# Patient Record
Sex: Male | Born: 1935 | Hispanic: No | Marital: Married | Smoking: Never smoker
Health system: Southern US, Community
[De-identification: ages and names within clinical notes are randomized; demographics above are authoritative.]

## PROBLEM LIST (undated history)

## (undated) DIAGNOSIS — F0390 Unspecified dementia without behavioral disturbance: Secondary | ICD-10-CM

## (undated) DIAGNOSIS — N400 Enlarged prostate without lower urinary tract symptoms: Secondary | ICD-10-CM

## (undated) HISTORY — DX: Benign prostatic hyperplasia without lower urinary tract symptoms: N40.0

---

## 2005-05-23 ENCOUNTER — Encounter: Admission: RE | Admit: 2005-05-23 | Discharge: 2005-05-23 | Payer: Self-pay | Admitting: Family Medicine

## 2005-05-26 ENCOUNTER — Encounter: Admission: RE | Admit: 2005-05-26 | Discharge: 2005-05-26 | Payer: Self-pay | Admitting: Family Medicine

## 2010-01-29 ENCOUNTER — Encounter: Payer: Self-pay | Admitting: Gastroenterology

## 2014-03-18 ENCOUNTER — Ambulatory Visit (INDEPENDENT_AMBULATORY_CARE_PROVIDER_SITE_OTHER): Payer: Medicare Other | Admitting: Internal Medicine

## 2014-03-18 ENCOUNTER — Other Ambulatory Visit (INDEPENDENT_AMBULATORY_CARE_PROVIDER_SITE_OTHER): Payer: Medicare Other

## 2014-03-18 VITALS — BP 158/76 | HR 89 | Temp 97.8°F | Resp 18 | Wt 130.0 lb

## 2014-03-18 DIAGNOSIS — N400 Enlarged prostate without lower urinary tract symptoms: Secondary | ICD-10-CM

## 2014-03-18 DIAGNOSIS — R413 Other amnesia: Secondary | ICD-10-CM

## 2014-03-18 DIAGNOSIS — Z0000 Encounter for general adult medical examination without abnormal findings: Secondary | ICD-10-CM

## 2014-03-18 LAB — COMPREHENSIVE METABOLIC PANEL
ALBUMIN: 4.4 g/dL (ref 3.5–5.2)
ALT: 19 U/L (ref 0–53)
AST: 20 U/L (ref 0–37)
Alkaline Phosphatase: 65 U/L (ref 39–117)
BUN: 26 mg/dL — AB (ref 6–23)
CALCIUM: 10.1 mg/dL (ref 8.4–10.5)
CO2: 30 meq/L (ref 19–32)
Chloride: 101 mEq/L (ref 96–112)
Creatinine, Ser: 1.27 mg/dL (ref 0.40–1.50)
GFR: 56.87 mL/min — ABNORMAL LOW (ref >60.00)
Glucose, Bld: 83 mg/dL (ref 70–99)
Potassium: 4.4 mEq/L (ref 3.5–5.1)
SODIUM: 136 meq/L (ref 135–145)
TOTAL PROTEIN: 7.8 g/dL (ref 6.0–8.3)
Total Bilirubin: 0.4 mg/dL (ref 0.2–1.2)

## 2014-03-18 LAB — VITAMIN B12: Vitamin B-12: 687 pg/mL (ref 211–911)

## 2014-03-18 LAB — LIPID PANEL
CHOLESTEROL: 220 mg/dL — AB (ref 0–200)
HDL: 99.6 mg/dL (ref >39.00)
LDL CALC: 104 mg/dL — AB (ref 0–99)
NonHDL: 120.4
TRIGLYCERIDES: 84 mg/dL (ref 0.0–149.0)
Total CHOL/HDL Ratio: 2
VLDL: 16.8 mg/dL (ref 0.0–40.0)

## 2014-03-18 LAB — VITAMIN D 25 HYDROXY (VIT D DEFICIENCY, FRACTURES): VITD: 55.72 ng/mL (ref 30.00–100.00)

## 2014-03-18 LAB — FOLATE: Folate: >23.3 ng/mL (ref >5.9)

## 2014-03-18 MED ORDER — DONEPEZIL HCL 5 MG PO TABS: 5.0000 mg | ORAL_TABLET | Freq: Every day | ORAL | Status: DC

## 2014-03-18 NOTE — Progress Notes (Signed)
Pre visit review using our clinic review tool, if applicable. No additional management support is needed unless otherwise documented below in the visit note. 

## 2014-03-18 NOTE — Patient Instructions (Addendum)
We will check some blood work today to check some any nutrition problems. We will call you back about the results.   I have given you a prescription for the memory medicine called aricept (also donepezil). Take 1 pill daily. The most common side effect is stomach upset or loose stools. Those side effects usually go away in about 1-2 weeks but if they are severe call us and we can stop the medicine.   We will see you back in about 3 months to check on the memory.   Talk to the urologist about your concerns about the medicine on Monday. If you have any questions or problems please feel free to call the office.   Memory Compensation Strategies  1. Use "WARM" strategy.  W= write it down  A= associate it  R= repeat it  M= make a mental note  2.   You can keep a Glass blower/designerMemory Notebook.  Use a 3-ring notebook with sections for the following: calendar, important names and phone numbers,  medications, doctors' names/phone numbers, lists/reminders, and a section to journal what you did  each day.   3.    Use a calendar to write appointments down.  4.    Write yourself a schedule for the day.  This can be placed on the calendar or in a separate section of the Memory Notebook.  Keeping a  regular schedule can help memory.  5.    Use medication organizer with sections for each day or morning/evening pills.  You may need help loading it  6.    Keep a basket, or pegboard by the door.  Place items that you need to take out with you in the basket or on the pegboard.  You may also want to  include a message board for reminders.  7.    Use sticky notes.  Place sticky notes with reminders in a place where the task is performed.  For example: " turn off the  stove" placed by the stove, "lock the door" placed on the door at eye level, " take your medications" on  the bathroom mirror or by the place where you normally take your medications.  8.    Use alarms/timers.  Use while cooking to remind yourself to check  on food or as a reminder to take your medicine, or as a  reminder to make a call, or as a reminder to perform another task, etc.

## 2014-03-19 ENCOUNTER — Encounter: Payer: Self-pay | Admitting: Internal Medicine

## 2014-03-19 DIAGNOSIS — N400 Enlarged prostate without lower urinary tract symptoms: Secondary | ICD-10-CM | POA: Insufficient documentation

## 2014-03-19 DIAGNOSIS — R413 Other amnesia: Secondary | ICD-10-CM | POA: Insufficient documentation

## 2014-03-19 DIAGNOSIS — Z0000 Encounter for general adult medical examination without abnormal findings: Secondary | ICD-10-CM | POA: Insufficient documentation

## 2014-03-19 NOTE — Assessment & Plan Note (Signed)
He is having balance problems on the avodart and getting no benefit. He did not do well on combo (dulasteride/flomax) due to instability. He is due back to see urology on Monday and may need turp if unable to tolerate medication. Urinating every 15 minutes is quite significant.

## 2014-03-19 NOTE — Assessment & Plan Note (Signed)
Need to get records as they think he is up to date on things but are not sure of dates or what was done. Will address more at next visit.

## 2014-03-19 NOTE — Assessment & Plan Note (Signed)
Check B12, folate, vitamin D. If no problems found can pursue MRI brain to check for NPH (with the wobbling gait and urinary frequency). Start aricept 5 mg daily if no cause.

## 2014-03-19 NOTE — Progress Notes (Signed)
   Subjective:    Patient ID: Christopher Gross, male    DOB: 12-Mar-1924, 81 y.o.   MRN: [id number]  HPI The patient is an 81 YO man who is coming in as a new patient for memory loss. He is accompanied by his wife who helps to provide the history. He has not been driving for about 1 year and he is still struggling with this some. He does not go out on his own as his wife is concerned that he would wander and get lost. He is still able to recognize his family. He does not remember well recent events. This has been going on for several years but worse in the last 6 months or so. His other main complaint is urinating about every 15 minutes. He has seen urology and has been tried on several medicines. They have all made him dizzy and stumble when walking. In addition they have not worked and he is having to urinate every 15 minutes. They go back to him within a week. Denies fevers, chills. Denies headaches.   PMH, social history reviewed and updated at today's visit. Unable to get to family history due to acute complaint. Surgical history also incomplete. Allergies, medications, problem list reviewed and updated with the patient today.  Review of Systems  Constitutional: Positive for activity change. Negative for fever, chills, appetite change and unexpected weight change.       Not moving around much  HENT: Negative.   Eyes: Negative.   Respiratory: Negative for cough, chest tightness, shortness of breath and wheezing.   Cardiovascular: Negative for chest pain, palpitations and leg swelling.  Gastrointestinal: Negative for abdominal pain, diarrhea, constipation and abdominal distention.  Genitourinary: Positive for frequency and enuresis.  Musculoskeletal: Negative.   Skin: Negative.   Neurological: Positive for dizziness and light-headedness. Negative for weakness, numbness and headaches.       Memory impairment  Psychiatric/Behavioral: Negative.       Objective:   Physical Exam  Constitutional:  He appears well-developed and well-nourished.  HENT:  Head: Normocephalic and atraumatic.  Eyes: EOM are normal.  Neck: Normal range of motion.  Cardiovascular: Normal rate and regular rhythm.   Pulmonary/Chest: Effort normal and breath sounds normal. No respiratory distress. He has no wheezes.  Abdominal: Soft.  Musculoskeletal: He exhibits no edema.  Neurological: He is alert. Coordination abnormal.  Not oriented to year, unable to spell world backwards or do serial 7s.  Slow gait  Skin: Skin is warm and dry.   Filed Vitals:   03/18/14 1011  BP: 158/76  Pulse: 89  Temp: 97.8 F (36.6 C)  TempSrc: Oral  Resp: 18  Weight: 130 lb (58.968 kg)  SpO2: 97%      Assessment & Plan:

## 2014-03-20 ENCOUNTER — Other Ambulatory Visit: Payer: Self-pay | Admitting: Geriatric Medicine

## 2014-03-20 MED ORDER — DONEPEZIL HCL 5 MG PO TABS: 5.0000 mg | ORAL_TABLET | Freq: Every day | ORAL | Status: DC

## 2014-05-18 ENCOUNTER — Emergency Department (HOSPITAL_COMMUNITY): Payer: Medicare Other

## 2014-05-18 ENCOUNTER — Emergency Department (HOSPITAL_COMMUNITY)
Admission: EM | Admit: 2014-05-18 | Discharge: 2014-05-18 | Disposition: A | Discharge status: Home / Self Care | Payer: Medicare Other | Attending: Emergency Medicine | Admitting: Emergency Medicine

## 2014-05-18 ENCOUNTER — Encounter (HOSPITAL_COMMUNITY): Payer: Self-pay | Admitting: Cardiology

## 2014-05-18 DIAGNOSIS — W19XXXA Unspecified fall, initial encounter: Secondary | ICD-10-CM

## 2014-05-18 DIAGNOSIS — Y92002 Bathroom of unspecified non-institutional (private) residence single-family (private) house as the place of occurrence of the external cause: Secondary | ICD-10-CM | POA: Insufficient documentation

## 2014-05-18 DIAGNOSIS — S0990XA Unspecified injury of head, initial encounter: Secondary | ICD-10-CM | POA: Insufficient documentation

## 2014-05-18 DIAGNOSIS — Y9389 Activity, other specified: Secondary | ICD-10-CM | POA: Insufficient documentation

## 2014-05-18 DIAGNOSIS — S0181XA Laceration without foreign body of other part of head, initial encounter: Secondary | ICD-10-CM

## 2014-05-18 DIAGNOSIS — Z79899 Other long term (current) drug therapy: Secondary | ICD-10-CM | POA: Insufficient documentation

## 2014-05-18 DIAGNOSIS — S40012A Contusion of left shoulder, initial encounter: Secondary | ICD-10-CM | POA: Diagnosis not present

## 2014-05-18 DIAGNOSIS — S01112A Laceration without foreign body of left eyelid and periocular area, initial encounter: Secondary | ICD-10-CM | POA: Diagnosis not present

## 2014-05-18 DIAGNOSIS — Z23 Encounter for immunization: Secondary | ICD-10-CM | POA: Diagnosis not present

## 2014-05-18 DIAGNOSIS — W01198A Fall on same level from slipping, tripping and stumbling with subsequent striking against other object, initial encounter: Secondary | ICD-10-CM | POA: Diagnosis not present

## 2014-05-18 DIAGNOSIS — S199XXA Unspecified injury of neck, initial encounter: Secondary | ICD-10-CM | POA: Diagnosis not present

## 2014-05-18 DIAGNOSIS — Z87448 Personal history of other diseases of urinary system: Secondary | ICD-10-CM | POA: Insufficient documentation

## 2014-05-18 DIAGNOSIS — Y998 Other external cause status: Secondary | ICD-10-CM | POA: Insufficient documentation

## 2014-05-18 MED ORDER — TETANUS-DIPHTH-ACELL PERTUSSIS 5-2.5-18.5 LF-MCG/0.5 IM SUSP
0.5000 mL | Freq: Once | INTRAMUSCULAR | Status: AC
  Administered 2014-05-18: 0.5 mL via INTRAMUSCULAR
  Filled 2014-05-18: qty 0.5

## 2014-05-18 MED ORDER — ACETAMINOPHEN 325 MG PO TABS
650.0000 mg | ORAL_TABLET | Freq: Once | ORAL | Status: AC
  Administered 2014-05-18: 650 mg via ORAL
  Filled 2014-05-18: qty 2

## 2014-05-18 NOTE — ED Notes (Signed)
Tori, PA-C, at the bedside.  

## 2014-05-18 NOTE — Discharge Instructions (Signed)
Return to the emergency room with worsening of symptoms, new symptoms or with symptoms that are concerning , especially severe worsening of headache, visual or speech changes, weakness in face, arms or legs. °Please call your doctor for a followup appointment within 24-48 hours. When you talk to your doctor please let them know that you were seen in the emergency department and have them acquire all of your records so that they can discuss the findings with you and formulate a treatment plan to fully care for your new and ongoing problems.  °Read below information and follow recommendations. °Head Injury °You have received a head injury. It does not appear serious at this time. Headaches and vomiting are common following head injury. It should be easy to awaken from sleeping. Sometimes it is necessary for you to stay in the emergency department for a while for observation. Sometimes admission to the hospital may be needed. After injuries such as yours, most problems occur within the first 24 hours, but side effects may occur up to 7-10 days after the injury. It is important for you to carefully monitor your condition and contact your health care provider or seek immediate medical care if there is a change in your condition. °WHAT ARE THE TYPES OF HEAD INJURIES? °Head injuries can be as minor as a bump. Some head injuries can be more severe. More severe head injuries include: °· A jarring injury to the brain (concussion). °· A bruise of the brain (contusion). This mean there is bleeding in the brain that can cause swelling. °· A cracked skull (skull fracture). °· Bleeding in the brain that collects, clots, and forms a bump (hematoma). °WHAT CAUSES A HEAD INJURY? °A serious head injury is most likely to happen to someone who is in a car wreck and is not wearing a seat belt. Other causes of major head injuries include bicycle or motorcycle accidents, sports injuries, and falls. °HOW ARE HEAD INJURIES DIAGNOSED? °A  complete history of the event leading to the injury and your current symptoms will be helpful in diagnosing head injuries. Many times, pictures of the brain, such as CT or MRI are needed to see the extent of the injury. Often, an overnight hospital stay is necessary for observation.  °WHEN SHOULD I SEEK IMMEDIATE MEDICAL CARE?  °You should get help right away if: °· You have confusion or drowsiness. °· You feel sick to your stomach (nauseous) or have continued, forceful vomiting. °· You have dizziness or unsteadiness that is getting worse. °· You have severe, continued headaches not relieved by medicine. Only take over-the-counter or prescription medicines for pain, fever, or discomfort as directed by your health care provider. °· You do not have normal function of the arms or legs or are unable to walk. °· You notice changes in the black spots in the center of the colored part of your eye (pupil). °· You have a clear or bloody fluid coming from your nose or ears. °· You have a loss of vision. °During the next 24 hours after the injury, you must stay with someone who can watch you for the warning signs. This person should contact local emergency services (911 in the [place] if you have seizures, you become unconscious, or you are unable to wake up. °HOW CAN I PREVENT A HEAD INJURY IN THE FUTURE? °The most important factor for preventing major head injuries is avoiding motor vehicle accidents.  To minimize the potential for damage to your head, it is crucial to wear seat   belts while riding in motor vehicles. Wearing helmets while bike riding and playing collision sports (like football) is also helpful. Also, avoiding dangerous activities around the house will further help reduce your risk of head injury.  °WHEN CAN I RETURN TO NORMAL ACTIVITIES AND ATHLETICS? °You should be reevaluated by your health care provider before returning to these activities. If you have any of the following symptoms, you should not return to  activities or contact sports until 1 week after the symptoms have stopped: °· Persistent headache. °· Dizziness or vertigo. °· Poor attention and concentration. °· Confusion. °· Memory problems. °· Nausea or vomiting. °· Fatigue or tire easily. °· Irritability. °· Intolerant of bright lights or loud noises. °· Anxiety or depression. °· Disturbed sleep. °MAKE SURE YOU:  °· Understand these instructions. °· Will watch your condition. °· Will get help right away if you are not doing well or get worse. °Document Released: 12/26/2004 Document Revised: 12/31/2012 Document Reviewed: 09/02/2012 °[employer]® Patient Information ©2015 [employer]. This information is not intended to replace advice given to you by your health care provider. Make sure you discuss any questions you have with your health care provider. ° ° °

## 2014-05-18 NOTE — ED Provider Notes (Signed)
CSN: [id number]     Arrival date & time 05/18/14  1454 History   First MD Initiated Contact with Patient 05/18/14 1923     Chief Complaint  Patient presents with  . Fall     (Consider location/radiation/quality/duration/timing/severity/associated sxs/prior Treatment) HPI  Christopher Gross is a 81 y.o. male with PMH of BPH and dementia presenting with fall at 8:30 AM this morning. Patient was stepping out of the shower and slipped and hit his head on the toilet. He has laceration to his left outer eye. Patient denies any headache, visual changes, slurred speech. No loss of consciousness. Wife reports patient has been more sleepy than normal and he was sent from urgent care for further evaluation. Patient does not take any blood thinners. He denies any chest pain, shortness of breath, nausea, vomiting, back pain, abdominal pain. No lightheadedness or dizziness. No anticoagulants.   Past Medical History  Diagnosis Date  . BPH (benign prostatic hypertrophy)    History reviewed. No pertinent past surgical history. History reviewed. No pertinent family history. History  Substance Use Topics  . Smoking status: Never Smoker   . Smokeless tobacco: Not on file  . Alcohol Use: No    Review of Systems 10 Systems reviewed and are negative for acute change except as noted in the HPI.    Allergies  Review of patient's allergies indicates no known allergies.  Home Medications   Prior to Admission medications   Medication Sig Start Date End Date Taking? Authorizing Provider  Calcium Carbonate (CALCIUM 600 PO) Take 600 mg by mouth 2 (two) times daily.   Yes Historical Provider, MD  Cholecalciferol (VITAMIN D3) 400 UNITS CAPS Take 400 mg by mouth daily.   Yes Historical Provider, MD  donepezil (ARICEPT) 5 MG tablet Take 1 tablet (5 mg total) by mouth at bedtime. 03/20/14  Yes Judie Bonus, MD  glucosamine-chondroitin 500-400 MG tablet Take 1 tablet by mouth 2 (two) times daily.   Yes  Historical Provider, MD  Multiple Vitamins-Minerals (MULTI FOR HIM 50+ PO) Take by mouth daily.   Yes Historical Provider, MD   BP 154/77 mmHg  Pulse 76  Temp(Src) 98.3 F (36.8 C) (Oral)  Resp 17  SpO2 95% Physical Exam  Constitutional: He is oriented to person, place, and time. He appears well-developed and well-nourished. No distress.  HENT:  Head: Normocephalic.  Mouth/Throat: Oropharynx is clear and moist.  1 cm vertical laceration to left upper eyelid with surrounding hematoma. No gross contamination. No active bleeding.  Eyes: Conjunctivae and EOM are normal. Pupils are equal, round, and reactive to light. Right eye exhibits no discharge. Left eye exhibits no discharge.  Neck: Normal range of motion. Neck supple.  No nuchal rigidity Mild midline neck tenderness   Cardiovascular: Normal rate and regular rhythm.   Pulmonary/Chest: Effort normal and breath sounds normal. No respiratory distress. He has no wheezes.  Abdominal: Soft. Bowel sounds are normal. He exhibits no distension. There is no tenderness.  Musculoskeletal:  No clavicular step-off or crepitus. No left shoulder deformity. Ecchymoses to left mid anterior humerus without significant tenderness. Range of motion of left upper extremity without pain.  Neurological: He is alert and oriented to person, place, and time. No cranial nerve deficit. Coordination normal.  Speech is clear and goal oriented. No facial droop. Strength 5/5 in upper and lower extremities. Sensation intact. Intact rapid alternating movements, finger to nose, and heel to shin. No pronator drift. Normal gait.   Skin: Skin is warm  and dry. He is not diaphoretic.  Nursing note and vitals reviewed.   ED Course  Procedures (including critical care time) Labs Review Labs Reviewed - No data to display  Imaging Review Ct Head Wo Contrast  05/18/2014   CLINICAL DATA:  Larey SeatFell this morning in the shower and then again shortly afterward, striking his head.  There is a laceration in the left periorbital region.  EXAM: CT HEAD WITHOUT CONTRAST  CT CERVICAL SPINE WITHOUT CONTRAST  TECHNIQUE: Multidetector CT imaging of the head and cervical spine was performed following the standard protocol without intravenous contrast. Multiplanar CT image reconstructions of the cervical spine were also generated.  COMPARISON:  None.  FINDINGS: CT HEAD FINDINGS  There is no intracranial hemorrhage, mass or evidence of acute infarction. There is moderate generalized atrophy. There is moderate chronic microvascular ischemic change. There is no significant extra-axial fluid collection.  No acute intracranial findings are evident. The skull base and calvarium are intact.  CT CERVICAL SPINE FINDINGS  The vertebral column, pedicles and facet articulations are intact. There is no evidence of acute fracture. No acute soft tissue abnormalities are evident.  There are moderate degenerative disc changes throughout the cervical spine.  IMPRESSION: *Moderate generalized atrophy and chronic microvascular ischemic changes. No evidence of acute intracranial traumatic injury. *Negative for acute cervical spine fracture   Electronically Signed   By: Ellery Plunkaniel R Mitchell M.D.   On: 05/18/2014 20:44   Ct Cervical Spine Wo Contrast  05/18/2014   CLINICAL DATA:  Larey SeatFell this morning in the shower and then again shortly afterward, striking his head. There is a laceration in the left periorbital region.  EXAM: CT HEAD WITHOUT CONTRAST  CT CERVICAL SPINE WITHOUT CONTRAST  TECHNIQUE: Multidetector CT imaging of the head and cervical spine was performed following the standard protocol without intravenous contrast. Multiplanar CT image reconstructions of the cervical spine were also generated.  COMPARISON:  None.  FINDINGS: CT HEAD FINDINGS  There is no intracranial hemorrhage, mass or evidence of acute infarction. There is moderate generalized atrophy. There is moderate chronic microvascular ischemic change. There is  no significant extra-axial fluid collection.  No acute intracranial findings are evident. The skull base and calvarium are intact.  CT CERVICAL SPINE FINDINGS  The vertebral column, pedicles and facet articulations are intact. There is no evidence of acute fracture. No acute soft tissue abnormalities are evident.  There are moderate degenerative disc changes throughout the cervical spine.  IMPRESSION: *Moderate generalized atrophy and chronic microvascular ischemic changes. No evidence of acute intracranial traumatic injury. *Negative for acute cervical spine fracture   Electronically Signed   By: Ellery Plunkaniel R Mitchell M.D.   On: 05/18/2014 20:44   Dg Humerus Left  05/18/2014   CLINICAL DATA:  Bruising secondary to a fall 3 days ago.  EXAM: LEFT HUMERUS - 2+ VIEW  COMPARISON:  None.  FINDINGS: There is no fracture or dislocation or other focal bone lesion. There is osteopenia.  IMPRESSION: No acute abnormalities.   Electronically Signed   By: Francene BoyersJames  Maxwell M.D.   On: 05/18/2014 21:26     EKG Interpretation None      MDM   Final diagnoses:  Fall  Facial laceration, initial encounter  Head injury, initial encounter   Patient presenting after mechanical fall with head injury while trying to get out of the tub. VSS. Neurological exam without deficits. 1 cm laceration to left outer eyelid without gross contamination. There was irrigated in the ED. Injury occurred over 11 hours  ago. Will allow to heal by secondary intention. Tetanus updated. Head and neck CT without acute abnormalities. Left humerus without acute abnormality. Patient well appearing nontoxic and stable for discharge home with his wife. He should've follow-up with PCP in one week.  Discussed return precautions with patient. Discussed all results and patient verbalizes understanding and agrees with plan.  This is a shared patient. This patient was discussed with the physician who saw and evaluated the patient and agrees with the  plan.      Oswaldo ConroyVictoria Zaelyn Barbary, PA-C 05/18/14 2337  Tilden FossaElizabeth Rees, MD 05/19/14 [id number]

## 2014-05-18 NOTE — ED Notes (Signed)
Spoke to Lyndonori, PA-C, no suturing needed for left eye.

## 2014-05-18 NOTE — ED Notes (Signed)
Called xray to report patient is ready for transport.  

## 2014-05-18 NOTE — ED Notes (Signed)
Dr. Pecola Leisureeese at the bedside to update on plan of care.

## 2014-05-18 NOTE — ED Notes (Signed)
TurkeyVictoria, pa-c, at the bedside.

## 2014-05-18 NOTE — ED Notes (Signed)
Pt fell this morning in the shower and then again and hit his head on the toilet. Pt has a lac to the left outer eye. Wife reports the patient has been more sleepy since the fall and [facility] sent him here for further evaluation.

## 2014-06-18 ENCOUNTER — Ambulatory Visit: Payer: Medicare Other | Admitting: Internal Medicine

## 2014-06-18 DIAGNOSIS — Z0289 Encounter for other administrative examinations: Secondary | ICD-10-CM

## 2016-10-26 ENCOUNTER — Encounter: Payer: Self-pay | Admitting: Internal Medicine

## 2017-01-07 ENCOUNTER — Emergency Department (HOSPITAL_COMMUNITY): Payer: Medicare Other

## 2017-01-07 ENCOUNTER — Encounter (HOSPITAL_COMMUNITY): Payer: Self-pay | Admitting: Radiology

## 2017-01-07 ENCOUNTER — Emergency Department (HOSPITAL_COMMUNITY)
Admission: EM | Admit: 2017-01-07 | Discharge: 2017-01-07 | Disposition: A | Discharge status: Home / Self Care | Payer: Medicare Other | Attending: Emergency Medicine | Admitting: Emergency Medicine

## 2017-01-07 DIAGNOSIS — R296 Repeated falls: Secondary | ICD-10-CM | POA: Diagnosis not present

## 2017-01-07 DIAGNOSIS — R531 Weakness: Secondary | ICD-10-CM | POA: Diagnosis present

## 2017-01-07 DIAGNOSIS — Y939 Activity, unspecified: Secondary | ICD-10-CM | POA: Diagnosis not present

## 2017-01-07 DIAGNOSIS — Y92012 Bathroom of single-family (private) house as the place of occurrence of the external cause: Secondary | ICD-10-CM | POA: Insufficient documentation

## 2017-01-07 DIAGNOSIS — J069 Acute upper respiratory infection, unspecified: Secondary | ICD-10-CM | POA: Insufficient documentation

## 2017-01-07 DIAGNOSIS — Y999 Unspecified external cause status: Secondary | ICD-10-CM | POA: Insufficient documentation

## 2017-01-07 DIAGNOSIS — W1830XA Fall on same level, unspecified, initial encounter: Secondary | ICD-10-CM | POA: Insufficient documentation

## 2017-01-07 DIAGNOSIS — W19XXXA Unspecified fall, initial encounter: Secondary | ICD-10-CM

## 2017-01-07 DIAGNOSIS — F0390 Unspecified dementia without behavioral disturbance: Secondary | ICD-10-CM | POA: Insufficient documentation

## 2017-01-07 DIAGNOSIS — R2689 Other abnormalities of gait and mobility: Secondary | ICD-10-CM | POA: Insufficient documentation

## 2017-01-07 LAB — URINALYSIS, ROUTINE W REFLEX MICROSCOPIC
BILIRUBIN URINE: NEGATIVE
Bacteria, UA: NONE SEEN
Glucose, UA: NEGATIVE mg/dL
HGB URINE DIPSTICK: NEGATIVE
Ketones, ur: NEGATIVE mg/dL
Leukocytes, UA: NEGATIVE
NITRITE: NEGATIVE
PH: 7 (ref 5.0–8.0)
Protein, ur: 30 mg/dL — AB
SPECIFIC GRAVITY, URINE: 1.016 (ref 1.005–1.030)
Squamous Epithelial / LPF: NONE SEEN

## 2017-01-07 LAB — COMPREHENSIVE METABOLIC PANEL
ALK PHOS: 58 U/L (ref 38–126)
ALT: 21 U/L (ref 17–63)
ANION GAP: 7 (ref 5–15)
AST: 28 U/L (ref 15–41)
Albumin: 4.1 g/dL (ref 3.5–5.0)
BUN: 24 mg/dL — ABNORMAL HIGH (ref 6–20)
CALCIUM: 9.5 mg/dL (ref 8.9–10.3)
CO2: 28 mmol/L (ref 22–32)
Chloride: 100 mmol/L — ABNORMAL LOW (ref 101–111)
Creatinine, Ser: 1.18 mg/dL (ref 0.61–1.24)
GFR calc Af Amer: >60 mL/min (ref >60)
GFR calc non Af Amer: 52 mL/min — ABNORMAL LOW (ref >60)
Glucose, Bld: 101 mg/dL — ABNORMAL HIGH (ref 65–99)
Potassium: 4 mmol/L (ref 3.5–5.1)
SODIUM: 135 mmol/L (ref 135–145)
Total Bilirubin: 0.5 mg/dL (ref 0.3–1.2)
Total Protein: 7.1 g/dL (ref 6.5–8.1)

## 2017-01-07 LAB — CBC WITH DIFFERENTIAL/PLATELET
Basophils Absolute: 0 10*3/uL (ref 0.0–0.1)
Basophils Relative: 0 %
EOS ABS: 0 10*3/uL (ref 0.0–0.7)
Eosinophils Relative: 0 %
HCT: 36.7 % — ABNORMAL LOW (ref 39.0–52.0)
Hemoglobin: 12.4 g/dL — ABNORMAL LOW (ref 13.0–17.0)
LYMPHS ABS: 0.4 10*3/uL — AB (ref 0.7–4.0)
Lymphocytes Relative: 6 %
MCH: 30.9 pg (ref 26.0–34.0)
MCHC: 33.8 g/dL (ref 30.0–36.0)
MCV: 91.5 fL (ref 78.0–100.0)
Monocytes Absolute: 1.1 10*3/uL — ABNORMAL HIGH (ref 0.1–1.0)
Monocytes Relative: 16 %
NEUTROS PCT: 78 %
Neutro Abs: 5.6 10*3/uL (ref 1.7–7.7)
Platelets: 192 10*3/uL (ref 150–400)
RBC: 4.01 MIL/uL — ABNORMAL LOW (ref 4.22–5.81)
RDW: 13.5 % (ref 11.5–15.5)
WBC: 7.2 10*3/uL (ref 4.0–10.5)

## 2017-01-07 LAB — I-STAT TROPONIN, ED: Troponin i, poc: 0.01 ng/mL (ref 0.00–0.08)

## 2017-01-07 LAB — I-STAT CG4 LACTIC ACID, ED: LACTIC ACID, VENOUS: 0.87 mmol/L (ref 0.5–1.9)

## 2017-01-07 NOTE — ED Notes (Signed)
Bed: WA04 Expected date:  Expected time:  Means of arrival:  Comments: 83 yo fall 

## 2017-01-07 NOTE — ED Provider Notes (Signed)
[facility]-EMERGENCY DEPT Provider Note   CSN: [id number] Arrival date & time: 01/07/17  1657     History   Chief Complaint Chief Complaint  Patient presents with  . Fall  . Weakness    HPI Christopher Gross is a 83 y.o. male.  HPI   Larey Seat twice today, once getting out of bed at 3-4AM, fell onto carpet This afternoon getting up to go to the bathroom, has had balance problems and dizziness, hanging onto things as he walks and fell again  Is having more trouble walking than usual.  Has had balance issues, but has been progressive, worse over the last week.  Has not started any new medications No focal weakness, no focal numbness, thinks maybe right side of mouth drooping more.  This morning was having trouble bringing juice to his mouth, used both hands, tremor this morning too.    Fell landing on [employer], operational No LOC, seemed dazed. Head was down, not sure if he hit it.  Has fallen several times trying to go out and feed the birds, also fell in snow  Has had cough, congestion for a few days No known fevers     Past Medical History:  Diagnosis Date  . BPH (benign prostatic hypertrophy)     Patient Active Problem List   Diagnosis Date Noted  . Memory loss 03/19/2014  . BPH (benign prostatic hypertrophy) 03/19/2014  . Routine general medical examination at a health care facility 03/19/2014    No past surgical history on file.     Home Medications    Prior to Admission medications   Medication Sig Start Date End Date Taking? Authorizing Provider  Calcium Carbonate (CALCIUM 600 PO) Take 600 mg by mouth 2 (two) times daily.   Yes [provider]  Cholecalciferol (VITAMIN D3) 400 UNITS CAPS Take 400 mg by mouth daily.   Yes [provider]  doxazosin (CARDURA) 4 MG tablet Take 4 mg by mouth daily.   Yes [provider]  finasteride (PROSCAR) 5 MG tablet Take 5 mg by mouth at bedtime.   Yes [provider]  glucosamine-chondroitin 500-400 MG tablet Take 1 tablet by mouth 2 (two) times daily.   Yes [provider]  Multiple Vitamins-Minerals (MULTI FOR HIM 50+ PO) Take by mouth daily.   Yes [provider]  donepezil (ARICEPT) 5 MG tablet Take 1 tablet (5 mg total) by mouth at bedtime. Patient not taking: Reported on 01/07/2017 03/20/14   Myrlene Broker, MD    Family History No family history on file.  Social History Social History   Tobacco Use  . Smoking status: Never Smoker  Substance Use Topics  . Alcohol use: No  . Drug use: Not on file     Allergies   Patient has no known allergies.   Review of Systems Review of Systems  Unable to perform ROS: Dementia  Constitutional: Negative for fever.  HENT: Positive for congestion.   Respiratory: Positive for cough.   Cardiovascular: Negative for chest pain.  Neurological: Negative for weakness and numbness.     Physical Exam Updated Vital Signs BP (!) 150/74 (BP Location: Left Arm)   Pulse 83   Temp 98.8 F (37.1 C) (Oral)   Resp 14   SpO2 100%   Physical Exam  Constitutional: He appears well-developed and well-nourished. No distress.  HENT:  Head: Normocephalic and atraumatic.  Nasal congestion  Eyes: Conjunctivae and EOM are normal.  Neck: Normal range of motion.  Cardiovascular: Normal rate, regular rhythm, normal heart sounds and intact distal pulses. Exam reveals no gallop and no friction rub.  No murmur heard. Pulmonary/Chest: Effort normal and breath sounds normal. No respiratory distress. He has no wheezes. He has no rales.  Abdominal: Soft. He exhibits no distension. There is no tenderness. There is no guarding.  Musculoskeletal: He exhibits no edema.  Neurological: He is alert. He has normal strength. No cranial nerve deficit or sensory deficit. Coordination (normal finger to nose) normal. GCS eye subscore is 4. GCS verbal subscore is 5. GCS motor subscore is 6.  5/5  upper and lower extremity strength   Skin: Skin is warm and dry. He is not diaphoretic.  Nursing note and vitals reviewed.    ED Treatments / Results  Labs (all labs ordered are listed, but only abnormal results are displayed) Labs Reviewed  CBC WITH DIFFERENTIAL/PLATELET - Abnormal; Notable for the following components:      Result Value   RBC 4.01 (*)    Hemoglobin 12.4 (*)    HCT 36.7 (*)    Lymphs Abs 0.4 (*)    Monocytes Absolute 1.1 (*)    All other components within normal limits  COMPREHENSIVE METABOLIC PANEL - Abnormal; Notable for the following components:   Chloride 100 (*)    Glucose, Bld 101 (*)    BUN 24 (*)    GFR calc non Af Amer 52 (*)    All other components within normal limits  URINALYSIS, ROUTINE W REFLEX MICROSCOPIC - Abnormal; Notable for the following components:   APPearance HAZY (*)    Protein, ur 30 (*)    All other components within normal limits  URINE CULTURE  I-STAT CG4 LACTIC ACID, ED  I-STAT TROPONIN, ED    EKG  EKG Interpretation  Date/Time:  Sunday January 07 2017 17:12:25 EST Ventricular Rate:  86 PR Interval:    QRS Duration: 98 QT Interval:  384 QTC Calculation: 460 R Axis:   -44 Text Interpretation:  Normal sinus rhythm Left axis deviation No previous ECGs available Confirmed by Alvira MondaySchlossman, Halea Lieb ([id number]) on 01/07/2017 5:54:44 PM       Radiology Dg Chest 2 View  Result Date: 01/07/2017 CLINICAL DATA:  Runny nose yesterday. EXAM: CHEST  2 VIEW COMPARISON:  None. FINDINGS: Cardiopericardial silhouette is at upper limits of normal for size. Interstitial markings are diffusely coarsened with chronic features. Several scattered small pulmonary nodules are identified bilaterally. Bones are diffusely demineralized. Bones are diffusely demineralized. IMPRESSION: 1. Chronic interstitial changes with scattered tiny bilateral pulmonary nodules. CT chest recommended to further evaluate. Electronically Signed   By: Kennith CenterEric  Mansell M.D.   On:  01/07/2017 19:10   Ct Head Wo Contrast  Result Date: 01/07/2017 CLINICAL DATA:  Status post fall today with headaches. EXAM: CT HEAD WITHOUT CONTRAST TECHNIQUE: Contiguous axial images were obtained from the base of the skull through the vertex without intravenous contrast. COMPARISON:  May 18, 2014 FINDINGS: Brain: No evidence of acute infarction, hemorrhage, hydrocephalus, extra-axial collection or mass lesion/mass effect. There is chronic diffuse atrophy. Chronic bilateral periventricular white matter small vessel ischemic change is identified. Vascular: No hyperdense vessel or unexpected calcification. Skull: Normal. Negative for fracture or focal lesion. Sinuses/Orbits: Mucoperiosteal thickening of the left maxillary sinus and bilateral ethmoid sinuses are noted. The orbits are normal. Other: None. IMPRESSION: No focal acute intracranial abnormality identified. Chronic diffuse atrophy and chronic bilateral periventricular white matter small vessel ischemic change. Mucoperiosteal thickening  of bilateral ethmoid sinuses and left maxillary sinus. Electronically Signed   By: Sherian ReinWei-Chen  Lin M.D.   On: 01/07/2017 20:20    Procedures Procedures (including critical care time)  Medications Ordered in ED Medications - No data to display   Initial Impression / Assessment and Plan / ED Course  I have reviewed the triage vital signs and the nursing notes.  Pertinent labs & imaging results that were available during my care of the patient were reviewed by me and considered in my medical decision making (see chart for details).     [age] year old male with a history of dementia, BPH, presents with concern for frequent falls, difficulty ambulating.  Symptoms have been slowly progressive over time.  CT done shows chronic changes, without signs of acute abnormalities.  There is sign of sinus disease.  Chest x-ray shows no evidence of pneumonia or congestive heart failure.  Urinalysis shows no sign of infection.   Lactic acid is within normal limits.  Troponin is within normal normal limits.  EKG shows a sinus rhythm without acute abnormalities.  His neurologic exam is normal, with normal coordination, normal strength, and have low suspicion for acute CVA.  Given progression of symptoms, suspect these are likely in setting of worsening dementia.  Patient may also have a degree of generalized weakness from viral upper respiratory infection.  Place consult to social work and case management, given patient's falls.  Placed order for home health, face-to-face for PT, OT, social work, home health aide and nursing.  Outpatient prescription for a walker.  Recommend close outpatient follow-up with his primary care physician, and continue supportive care for URI.     Final Clinical Impressions(s) / ED Diagnoses   Final diagnoses:  Fall, initial encounter  Balance problems  Dementia without behavioral disturbance, unspecified dementia type  Upper respiratory tract infection, unspecified type    ED Discharge Orders        Ordered    Home Health     01/07/17 2111    Face-to-face encounter (required for Medicare/Medicaid patients)    Comments:  I Lynnea FerrierErin E Gabrielle Mester certify that this patient is under my care and that I, or a nurse practitioner or physician's assistant working with me, had a face-to-face encounter that meets the physician face-to-face encounter requirements with this patient on 01/07/2017. The encounter with the patient was in whole, or in part for the following medical condition(s) which is the primary reason for home health care (List medical condition): falls, dementia   01/07/17 2111    Home Health     01/07/17 2226    Face-to-face encounter (required for Medicare/Medicaid patients)    Comments:  I Lynnea FerrierErin E Saoirse Legere certify that this patient is under my care and that I, or a nurse practitioner or physician's assistant working with me, had a face-to-face encounter that meets the physician  face-to-face encounter requirements with this patient on 01/07/2017. The encounter with the patient was in whole, or in part for the following medical condition(s) which is the primary reason for home health care (List medical condition): dementia, falls   01/07/17 2226    Walker standard     01/07/17 2226       Alvira MondaySchlossman, Donal Lynam, MD 01/08/17 [phone]

## 2017-01-07 NOTE — ED Triage Notes (Signed)
Pt is from home with wife. Per pt wife, pt has fallen twice today (which is normal) however wife states that he is moving slower than normal and weaker. Pt has intermittent confusion which is also normal.

## 2017-01-08 ENCOUNTER — Telehealth: Payer: Self-pay | Admitting: Emergency Medicine

## 2017-01-08 NOTE — Telephone Encounter (Signed)
After CM attempted calling pt at home, it was noted the [facility] was [facility] which [facility].  CM contacted [facility], [facility], and [facility] all of which could not accept pt.  CM spoke with Katrina with [facility] who stated she could accept the pt.  CM advised her that CM has not spoken with pt and or wife today.  She will attempt contact.  No further CM needs noted at this time.

## 2017-01-08 NOTE — Care Management Note (Signed)
Case Management Note  Patient Details  Name: Christopher Gross MRN: [id number] Date of Birth: [birth year]  Please see telephone note.  Expected Discharge Date:    01/07/2017              Expected Discharge Plan:  Home w Home Health Services  Discharge planning Services  CM Consult  Post Acute Care Choice:  Home Health Choice offered to:  Spouse  HH Arranged:  RN, PT, OT, Nurse's Aide, Social Work [employer] Agency:  [facility]  Status of Service:  Completed, signed off  Reyonna Haack, Lynnae Sandhoffngela N, RN 01/08/2017, 3:17 PM

## 2017-01-08 NOTE — Telephone Encounter (Signed)
CM attempted to call pt's home x 2 [facility]  Answering machine answers after several rings both times but there is no "beep" that occurs to leave a message.  CM will attempt to call again later.

## 2017-01-09 LAB — URINE CULTURE: CULTURE: NO GROWTH

## 2017-01-18 ENCOUNTER — Encounter (HOSPITAL_COMMUNITY): Payer: Self-pay | Admitting: Internal Medicine

## 2017-01-18 ENCOUNTER — Emergency Department (HOSPITAL_COMMUNITY): Payer: Medicare Other

## 2017-01-18 ENCOUNTER — Emergency Department (HOSPITAL_COMMUNITY)
Admission: EM | Admit: 2017-01-18 | Discharge: 2017-01-18 | Disposition: A | Discharge status: Home / Self Care | Payer: Medicare Other | Attending: Emergency Medicine | Admitting: Emergency Medicine

## 2017-01-18 DIAGNOSIS — W01198A Fall on same level from slipping, tripping and stumbling with subsequent striking against other object, initial encounter: Secondary | ICD-10-CM | POA: Insufficient documentation

## 2017-01-18 DIAGNOSIS — Y92009 Unspecified place in unspecified non-institutional (private) residence as the place of occurrence of the external cause: Secondary | ICD-10-CM | POA: Diagnosis not present

## 2017-01-18 DIAGNOSIS — S0101XA Laceration without foreign body of scalp, initial encounter: Secondary | ICD-10-CM | POA: Insufficient documentation

## 2017-01-18 DIAGNOSIS — Y998 Other external cause status: Secondary | ICD-10-CM | POA: Diagnosis not present

## 2017-01-18 DIAGNOSIS — Y939 Activity, unspecified: Secondary | ICD-10-CM | POA: Insufficient documentation

## 2017-01-18 LAB — CBG MONITORING, ED: GLUCOSE-CAPILLARY: 86 mg/dL (ref 65–99)

## 2017-01-18 MED ORDER — LIDOCAINE HCL (PF) 1 % IJ SOLN
5.0000 mL | Freq: Once | INTRAMUSCULAR | Status: AC
  Administered 2017-01-18: 5 mL
  Filled 2017-01-18: qty 5

## 2017-01-18 NOTE — ED Provider Notes (Signed)
[facility]-EMERGENCY DEPT Provider Note   CSN: [id number] Arrival date & time: 01/18/17  1803     History   Chief Complaint Chief Complaint  Patient presents with  . Fall    HPI Christopher Gross is a 84 y.o. male.  Per EMS, patient lost balance and fell at home, striking the back of his head on a vase. Small laceration at upper aspect of occipital area with minor bleeding. No report of loss of consciousness. Patient with dementia, does not recall how he was injured today. He is not on anti-coagulants. He is confused to time and event.   The history is provided by the EMS personnel. The history is limited by the absence of a caregiver. No language interpreter was used.  Fall  This is a recurrent problem. The current episode started 1 to 2 hours ago. The problem occurs daily. Pertinent negatives include no chest pain, no abdominal pain and no shortness of breath.    Past Medical History:  Diagnosis Date  . BPH (benign prostatic hypertrophy)     Patient Active Problem List   Diagnosis Date Noted  . Memory loss 03/19/2014  . BPH (benign prostatic hypertrophy) 03/19/2014  . Routine general medical examination at a health care facility 03/19/2014    History reviewed. No pertinent surgical history.     Home Medications    Prior to Admission medications   Medication Sig Start Date End Date Taking? Authorizing Provider  Calcium Carbonate (CALCIUM 600 PO) Take 600 mg by mouth 2 (two) times daily.    [provider]  Cholecalciferol (VITAMIN D3) 400 UNITS CAPS Take 400 mg by mouth daily.    [provider]  donepezil (ARICEPT) 5 MG tablet Take 1 tablet (5 mg total) by mouth at bedtime. Patient not taking: Reported on 01/07/2017 03/20/14   Myrlene Broker, MD  doxazosin (CARDURA) 4 MG tablet Take 4 mg by mouth daily.    [provider]  finasteride (PROSCAR) 5 MG tablet Take 5 mg by mouth at bedtime.    [provider]  glucosamine-chondroitin 500-400 MG tablet Take 1 tablet by mouth 2 (two) times daily.    [provider]  Multiple Vitamins-Minerals (MULTI FOR HIM 50+ PO) Take by mouth daily.    [provider]    Family History No family history on file.  Social History Social History   Tobacco Use  . Smoking status: Never Smoker  Substance Use Topics  . Alcohol use: No  . Drug use: Not on file     Allergies   Patient has no known allergies.   Review of Systems Review of Systems  Unable to perform ROS: Dementia  Respiratory: Negative for shortness of breath.   Cardiovascular: Negative for chest pain.  Gastrointestinal: Negative for abdominal pain.     Physical Exam Updated Vital Signs BP (!) 163/95 (BP Location: Left Arm)   Pulse 74   Temp 97.7 F (36.5 C) (Oral)   Resp 16   SpO2 98% Comment: RA  Physical Exam  Constitutional: He appears well-developed and well-nourished.  HENT:  Laceration upper aspect of occipital area.  Eyes: Conjunctivae are normal. Pupils are equal, round, and reactive to light.  Neck: Normal range of motion.  Cardiovascular: Normal rate and regular rhythm.  Pulmonary/Chest: Effort normal and breath sounds normal.  Abdominal: Soft. Bowel sounds are normal.  Musculoskeletal: Normal range of motion. He exhibits no edema or deformity.  Neurological: He is alert. He  has normal strength. GCS eye subscore is 4. GCS verbal subscore is 4. GCS motor subscore is 6.  Skin: Skin is warm and dry.  Psychiatric: He has a normal mood and affect.  Nursing note and vitals reviewed.    ED Treatments / Results  Labs (all labs ordered are listed, but only abnormal results are displayed) Labs Reviewed  CBG MONITORING, ED    EKG  EKG Interpretation None       Radiology Ct Head Wo Contrast  Result Date: 01/18/2017 CLINICAL DATA:  [age] y/o M; status post fall with impact to back of head. EXAM: CT HEAD WITHOUT CONTRAST TECHNIQUE: Contiguous  axial images were obtained from the base of the skull through the vertex without intravenous contrast. COMPARISON:  01/07/2017 CT head FINDINGS: Brain: No evidence of acute infarction, hemorrhage, hydrocephalus, extra-axial collection or mass lesion/mass effect. Stable small chronic lacunar infarcts within left thalamus and lentiform nucleus. Stable chronic microvascular ischemic changes and parenchymal volume loss of the brain. Vascular: Calcific atherosclerosis of carotid siphons and vertebral arteries. No hyperdense vessel identified. Skull: Left parietal region small scalp contusion with laceration. Debris is present within the scalp which may represent packing material. No calvarial fracture identified Sinuses/Orbits: Small left maxillary sinus fluid level and mild anterior ethmoid air cell mucosal thickening. Bilateral intra-ocular lens replacement. Normal aeration of mastoid air cells. Other: None. IMPRESSION: 1. Left parietal region small scalp contusion and laceration. Debris is present within scalp contusion, possibly packing material, clinical correlation recommended. 2. No acute intracranial abnormality identified. 3. Stable chronic microvascular ischemic changes and parenchymal volume loss of the brain. 4. Ethmoid sinus mucosal thickening and small left maxillary fluid level which may be due to trauma or sinusitis. Electronically Signed   By: Mitzi HansenLance  Furusawa-Stratton M.D.   On: 01/18/2017 20:01    Procedures .Marland Kitchen.Laceration Repair Date/Time: 01/18/2017 8:25 PM Performed by: Felicie MornSmith, Pharaoh Pio, NP Authorized by: Felicie MornSmith, Gelisa Tieken, NP   Consent:    Consent obtained:  Verbal   Consent given by:  Patient   Risks discussed:  Infection, retained foreign body and pain Anesthesia (see MAR for exact dosages):    Anesthesia method:  Local infiltration   Local anesthetic:  Lidocaine 1% w/o epi Laceration details:    Location:  Scalp   Scalp location:  Crown   Length (cm):  3 Repair type:    Repair type:   Simple Pre-procedure details:    Preparation:  Patient was prepped and draped in usual sterile fashion and imaging obtained to evaluate for foreign bodies Exploration:    Wound exploration: entire depth of wound probed and visualized     Wound extent: foreign bodies/material   Treatment:    Area cleansed with:  Saline and Betadine   Amount of cleaning:  Standard   Irrigation solution:  Sterile saline   Irrigation method:  Syringe   Visualized foreign bodies/material removed: yes   Skin repair:    Repair method:  Staples   Number of staples:  6 Approximation:    Approximation:  Close Post-procedure details:    Patient tolerance of procedure:  Tolerated well, no immediate complications   (including critical care time)  Medications Ordered in ED Medications  lidocaine (PF) (XYLOCAINE) 1 % injection 5 mL (not administered)     Initial Impression / Assessment and Plan / ED Course  I have reviewed the triage vital signs and the nursing notes.  Pertinent labs & imaging results that were available during my care of the patient were reviewed  by me and considered in my medical decision making (see chart for details).    Patient discussed with and seen by Dr. Criss Alvine.    Tetanus UTD. Laceration occurred < 12 hours prior to repair. Discussed laceration care with pt and answered questions. Pt to f-u for staple removal in 7 days and wound check sooner should there be signs of dehiscence or infection. Pt is hemodynamically stable with no complaints prior to dc.    Final Clinical Impressions(s) / ED Diagnoses   Final diagnoses:  Laceration of scalp, initial encounter    ED Discharge Orders    None       Felicie Morn, NP 01/18/17 1610    Pricilla Loveless, MD 01/19/17 [phone]

## 2017-01-18 NOTE — ED Notes (Signed)
Patient pulling leads off stating  taken to restroom via steady with wife.

## 2017-01-18 NOTE — ED Notes (Addendum)
ED Provider at bedside. 

## 2017-01-18 NOTE — ED Triage Notes (Addendum)
Pt arrived via [facility] from home after a fall. Pt reports losing balance and falling. Per pt and wife, pt hit head on a vase and that caused a minor laceration/contusion on the posterior side of his head. Denies LOC before, during, or after fall. Pt is not on blood thinners. Pt is alert and only oriented to himself (slightly oriented to the situation).

## 2017-01-18 NOTE — Discharge Instructions (Signed)
Staple removal in 7-10 days. °

## 2018-04-02 ENCOUNTER — Ambulatory Visit: Payer: Self-pay | Admitting: Family Medicine

## 2019-03-28 ENCOUNTER — Emergency Department (HOSPITAL_COMMUNITY)
Admission: EM | Admit: 2019-03-28 | Discharge: 2019-03-28 | Disposition: A | Discharge status: Home / Self Care | Payer: Medicare HMO | Attending: Emergency Medicine | Admitting: Emergency Medicine

## 2019-03-28 ENCOUNTER — Emergency Department (HOSPITAL_COMMUNITY): Payer: Medicare HMO

## 2019-03-28 ENCOUNTER — Other Ambulatory Visit: Payer: Self-pay

## 2019-03-28 DIAGNOSIS — Y92410 Unspecified street and highway as the place of occurrence of the external cause: Secondary | ICD-10-CM | POA: Diagnosis not present

## 2019-03-28 DIAGNOSIS — S0990XA Unspecified injury of head, initial encounter: Secondary | ICD-10-CM | POA: Diagnosis not present

## 2019-03-28 DIAGNOSIS — S0081XA Abrasion of other part of head, initial encounter: Secondary | ICD-10-CM

## 2019-03-28 DIAGNOSIS — Y999 Unspecified external cause status: Secondary | ICD-10-CM | POA: Diagnosis not present

## 2019-03-28 DIAGNOSIS — Z79899 Other long term (current) drug therapy: Secondary | ICD-10-CM | POA: Insufficient documentation

## 2019-03-28 DIAGNOSIS — S0083XA Contusion of other part of head, initial encounter: Secondary | ICD-10-CM | POA: Diagnosis present

## 2019-03-28 DIAGNOSIS — Y9301 Activity, walking, marching and hiking: Secondary | ICD-10-CM | POA: Diagnosis not present

## 2019-03-28 DIAGNOSIS — W010XXA Fall on same level from slipping, tripping and stumbling without subsequent striking against object, initial encounter: Secondary | ICD-10-CM | POA: Insufficient documentation

## 2019-03-28 MED ORDER — BACITRACIN ZINC 500 UNIT/GM EX OINT
TOPICAL_OINTMENT | Freq: Two times a day (BID) | CUTANEOUS | Status: DC
  Administered 2019-03-28: 1 via TOPICAL
  Filled 2019-03-28: qty 0.9

## 2019-03-28 MED ORDER — ACETAMINOPHEN 325 MG PO TABS
650.0000 mg | ORAL_TABLET | Freq: Once | ORAL | Status: AC
  Administered 2019-03-28: 650 mg via ORAL
  Filled 2019-03-28: qty 2

## 2019-03-28 NOTE — Care Management (Addendum)
ED CM reviewing record noted s/p fall,  ED evaluation still in progress TOC team will continue to follow for asssitance with transitional care planning.

## 2019-03-28 NOTE — ED Provider Notes (Signed)
[facility] EMERGENCY DEPARTMENT Provider Note   CSN: [id number] Arrival date & time: 03/28/19  1833     History Chief Complaint  Patient presents with  . Fall    [address] Christopher Gross is a 86 y.o. male.  Patient presents via EMS s/p fall. Was walking, and tripped on edge of curb, fell forward, hit head. No noted loc. Contusion/abrasions to forehead. Dull pain to head post fall, mild, persistent. Denies neck/back pain. No numbness/weakness. Tetanus up to date. Denies faintness or dizziness prior to fall. States felt fine all day, asymptomatic. No cp or discomfort. No sob. No fevers. Pt denies other pain or injury.   The history is provided by the patient and the EMS personnel.  Fall Pertinent negatives include no chest pain, no abdominal pain and no shortness of breath.       Past Medical History:  Diagnosis Date  . BPH (benign prostatic hypertrophy)     Patient Active Problem List   Diagnosis Date Noted  . Memory loss 03/19/2014  . BPH (benign prostatic hypertrophy) 03/19/2014  . Routine general medical examination at a health care facility 03/19/2014    No past surgical history on file.     No family history on file.  Social History   Tobacco Use  . Smoking status: Never Smoker  Substance Use Topics  . Alcohol use: No  . Drug use: Not on file    Home Medications Prior to Admission medications   Medication Sig Start Date End Date Taking? Authorizing Provider  Calcium Carbonate (CALCIUM 600 PO) Take 600 mg by mouth 2 (two) times daily.    [provider]  Cholecalciferol (VITAMIN D3) 400 UNITS CAPS Take 400 mg by mouth daily.    [provider]  donepezil (ARICEPT) 5 MG tablet Take 1 tablet (5 mg total) by mouth at bedtime. Patient not taking: Reported on 01/07/2017 03/20/14   Myrlene Broker, MD  doxazosin (CARDURA) 4 MG tablet Take 4 mg by mouth daily.    [provider]  finasteride (PROSCAR) 5 MG tablet Take 5  mg by mouth at bedtime.    [provider]  glucosamine-chondroitin 500-400 MG tablet Take 1 tablet by mouth 2 (two) times daily.    [provider]  Multiple Vitamins-Minerals (MULTI FOR HIM 50+ PO) Take by mouth daily.    [provider]    Allergies    Patient has no known allergies.  Review of Systems   Review of Systems  Constitutional: Negative for fever.  HENT: Negative for nosebleeds.   Eyes: Negative for pain and visual disturbance.  Respiratory: Negative for cough and shortness of breath.   Cardiovascular: Negative for chest pain.  Gastrointestinal: Negative for abdominal pain, nausea and vomiting.  Genitourinary: Negative for flank pain.  Musculoskeletal: Negative for back pain and neck pain.  Skin: Positive for wound.  Neurological: Negative for weakness and numbness.  Hematological: Does not bruise/bleed easily.       No anticoag use.   Psychiatric/Behavioral: Negative for confusion.    Physical Exam Updated Vital Signs BP (!) 207/106 (BP Location: Right Arm)   Pulse 73   Temp 98.3 F (36.8 C) (Oral)   Resp 20   SpO2 98%   Physical Exam Vitals and nursing note reviewed.  Constitutional:      Appearance: Normal appearance. He is well-developed.  HENT:     Head:     Comments: Contusion and abrasions to forehead. No fb seen or  felt. Facial bones/orbits grossly intact.     Nose: Nose normal.     Mouth/Throat:     Mouth: Mucous membranes are moist.     Pharynx: Oropharynx is clear.  Eyes:     General: No scleral icterus.    Conjunctiva/sclera: Conjunctivae normal.     Pupils: Pupils are equal, round, and reactive to light.  Neck:     Trachea: No tracheal deviation.  Cardiovascular:     Rate and Rhythm: Normal rate and regular rhythm.     Pulses: Normal pulses.     Heart sounds: Normal heart sounds. No murmur. No friction rub. No gallop.   Pulmonary:     Effort: Pulmonary effort is normal. No accessory muscle usage or  respiratory distress.     Breath sounds: Normal breath sounds.  Chest:     Chest wall: No tenderness.  Abdominal:     General: Bowel sounds are normal. There is no distension.     Palpations: Abdomen is soft.     Tenderness: There is no abdominal tenderness. There is no guarding.     Comments: No abd contusion or bruising.   Genitourinary:    Comments: No cva tenderness. Musculoskeletal:        General: No swelling.     Cervical back: Normal range of motion and neck supple. No rigidity.     Comments: Mid cervical tenderness, otherwise, CTLS spine, non tender, aligned, no step off. Good rom bil extremities without pain or focal bony tenderness.   Skin:    General: Skin is warm and dry.     Findings: No rash.  Neurological:     Mental Status: He is alert.     Comments: Alert, speech clear. GCS 15. Motor intact bil, stre 5/5. sens grossly intact bil.   Psychiatric:        Mood and Affect: Mood normal.     ED Results / Procedures / Treatments   Labs (all labs ordered are listed, but only abnormal results are displayed) Labs Reviewed - No data to display  EKG None  Radiology CT HEAD WO CONTRAST  Result Date: 03/28/2019 CLINICAL DATA:  86 year old male with history of posttraumatic headache after falling on to the street after tripping over a curb. EXAM: CT HEAD WITHOUT CONTRAST CT CERVICAL SPINE WITHOUT CONTRAST TECHNIQUE: Multidetector CT imaging of the head and cervical spine was performed following the standard protocol without intravenous contrast. Multiplanar CT image reconstructions of the cervical spine were also generated. COMPARISON:  Head CT 01/18/2017.  Cervical spine CT 05/18/2014. FINDINGS: CT HEAD FINDINGS Brain: Moderate cerebral atrophy. Patchy and confluent areas of decreased attenuation are noted throughout the deep and periventricular white matter of the cerebral hemispheres bilaterally, compatible with chronic microvascular ischemic disease. No evidence of acute  infarction, hemorrhage, hydrocephalus, extra-axial collection or mass lesion/mass effect. Vascular: No hyperdense vessel or unexpected calcification. Skull: Normal. Negative for fracture or focal lesion. Sinuses/Orbits: Small amount of intermediate attenuation fluid lying dependently in the left maxillary sinus, nonspecific. Other: Extensive high attenuation soft tissue swelling in the left frontal scalp. CT CERVICAL SPINE FINDINGS Alignment: Normal. Skull base and vertebrae: Chronic appearing compression fracture of T3 with 20% loss of anterior vertebral body height. No acute fracture. No primary bone lesion or focal pathologic process. Soft tissues and spinal canal: No prevertebral fluid or swelling. No visible canal hematoma. Disc levels: Multilevel degenerative disc disease, most pronounced at C5-C6 and C7-T1. Mild multilevel facet arthropathy. Upper chest: Unremarkable. Other: None. IMPRESSION:  1. Large left frontal scalp hematoma with no underlying displaced skull fracture or signs of significant acute intracranial trauma. 2. No evidence of acute traumatic injury to the cervical spine. 3. Moderate cerebral atrophy with chronic microvascular ischemic changes in the cerebral white matter, as above. 4. Multilevel degenerative disc disease and cervical spondylosis, as above. 5. Chronic appearing compression fracture of T3 with 20% loss of anterior vertebral body height. Electronically Signed   By: Trudie Reed M.D.   On: 03/28/2019 19:40   CT CERVICAL SPINE WO CONTRAST  Result Date: 03/28/2019 CLINICAL DATA:  86 year old male with history of posttraumatic headache after falling on to the street after tripping over a curb. EXAM: CT HEAD WITHOUT CONTRAST CT CERVICAL SPINE WITHOUT CONTRAST TECHNIQUE: Multidetector CT imaging of the head and cervical spine was performed following the standard protocol without intravenous contrast. Multiplanar CT image reconstructions of the cervical spine were also generated.  COMPARISON:  Head CT 01/18/2017.  Cervical spine CT 05/18/2014. FINDINGS: CT HEAD FINDINGS Brain: Moderate cerebral atrophy. Patchy and confluent areas of decreased attenuation are noted throughout the deep and periventricular white matter of the cerebral hemispheres bilaterally, compatible with chronic microvascular ischemic disease. No evidence of acute infarction, hemorrhage, hydrocephalus, extra-axial collection or mass lesion/mass effect. Vascular: No hyperdense vessel or unexpected calcification. Skull: Normal. Negative for fracture or focal lesion. Sinuses/Orbits: Small amount of intermediate attenuation fluid lying dependently in the left maxillary sinus, nonspecific. Other: Extensive high attenuation soft tissue swelling in the left frontal scalp. CT CERVICAL SPINE FINDINGS Alignment: Normal. Skull base and vertebrae: Chronic appearing compression fracture of T3 with 20% loss of anterior vertebral body height. No acute fracture. No primary bone lesion or focal pathologic process. Soft tissues and spinal canal: No prevertebral fluid or swelling. No visible canal hematoma. Disc levels: Multilevel degenerative disc disease, most pronounced at C5-C6 and C7-T1. Mild multilevel facet arthropathy. Upper chest: Unremarkable. Other: None. IMPRESSION: 1. Large left frontal scalp hematoma with no underlying displaced skull fracture or signs of significant acute intracranial trauma. 2. No evidence of acute traumatic injury to the cervical spine. 3. Moderate cerebral atrophy with chronic microvascular ischemic changes in the cerebral white matter, as above. 4. Multilevel degenerative disc disease and cervical spondylosis, as above. 5. Chronic appearing compression fracture of T3 with 20% loss of anterior vertebral body height. Electronically Signed   By: Trudie Reed M.D.   On: 03/28/2019 19:40    Procedures Procedures (including critical care time)  Medications Ordered in ED Medications - No data to  display  ED Course  I have reviewed the triage vital signs and the nursing notes.  Pertinent labs & imaging results that were available during my care of the patient were reviewed by me and considered in my medical decision making (see chart for details).    MDM Rules/Calculators/A&P                      Imaging studies ordered.   Reviewed nursing notes and prior charts for additional history.   Wounds cleaned, bacitracin and sterile dressing.   Acetaminophen po, po fluids.  CTs reviewed/interpreted by me -  No hem or fx.   Po fluids. Ambulate in ED.  Recheck content, alert, no new c/o.   Patient appears stable for d/c.      Final Clinical Impression(s) / ED Diagnoses Final diagnoses:  None    Rx / DC Orders ED Discharge Orders    None  Lajean Saver, MD 03/28/19 2017

## 2019-03-28 NOTE — ED Notes (Signed)
Pt care endorsed from [facility]. Pt conscious, breathing, and A&Ox4. No distress noted.

## 2019-03-28 NOTE — ED Triage Notes (Signed)
Pt here from home where he tripped over the curb and fell in the street when going for a walk. Pt remembers entirety of event, did not pass out. Hematoma to forehead. Pt confused on time but they do not know patient's baseline, as family was not present for the event. Pt c/o L wrist pain and headache.

## 2019-03-28 NOTE — Discharge Instructions (Signed)
It was our pleasure to provide your ER care today - we hope that you feel better.  Keep abrasions very clean. Wash with soap and water 1-2x/day. You may apply a thin coat of bacitracin to area for the next 2-3 days.   Take acetaminophen as need.  Fall precautions - use great care to avoid falling. Consider using walker for added stability.   Follow up closely with primary care doctor.   Return to ER if worse, new symptoms, new or severe pain, infection of wounds, or other concern.

## 2019-03-28 NOTE — ED Notes (Signed)
Patient transported to CT 

## 2019-03-28 NOTE — ED Notes (Signed)
Pt was discharged from the ED. Pt read and understood discharge paperwork. Pt had vital signs completed. Pt conscious, breathing, and A&Ox4. No distress noted. Pt speaking in complete sentences. Pt brought out of the ED via wheelchair. E-signature not available. 

## 2019-12-30 ENCOUNTER — Emergency Department (HOSPITAL_COMMUNITY)
Admission: EM | Admit: 2019-12-30 | Discharge: 2019-12-31 | Disposition: A | Discharge status: Home / Self Care | Payer: Medicare HMO | Attending: Emergency Medicine | Admitting: Emergency Medicine

## 2019-12-30 ENCOUNTER — Other Ambulatory Visit: Payer: Self-pay

## 2019-12-30 DIAGNOSIS — S0081XA Abrasion of other part of head, initial encounter: Secondary | ICD-10-CM | POA: Diagnosis not present

## 2019-12-30 DIAGNOSIS — F0390 Unspecified dementia without behavioral disturbance: Secondary | ICD-10-CM

## 2019-12-30 DIAGNOSIS — X58XXXA Exposure to other specified factors, initial encounter: Secondary | ICD-10-CM | POA: Diagnosis not present

## 2019-12-30 DIAGNOSIS — T148XXA Other injury of unspecified body region, initial encounter: Secondary | ICD-10-CM

## 2019-12-30 DIAGNOSIS — S0993XA Unspecified injury of face, initial encounter: Secondary | ICD-10-CM | POA: Diagnosis present

## 2019-12-30 NOTE — ED Notes (Signed)
Pt's wife to bedside.  Wife states that the pt has a history of dementia and is currently at baseline.  She reports that he must have gotten out of the

## 2019-12-30 NOTE — ED Triage Notes (Signed)
Pt to ER via EMS for altered mental status.  Pt was found wandering the streets alone by [employer].  Pt has no complaints, but is only alert to self.  Pt is a poor historian, but has a small laceration to left eyebrow.  Pt unable to verbalize how it happened.  Respirations even and unlabored.  NADN.

## 2019-12-31 NOTE — ED Provider Notes (Signed)
[facility]-EMERGENCY DEPT Provider Note   CSN: [id number] Arrival date & time: 12/30/19  2303     History Chief Complaint  Patient presents with  . Altered Mental Status    Christopher Gross is a 86 y.o. male.  Apparently patient is not altered at all.  Wife is here now states that he has dementia and has wondered for.  States that he got out today she is not sure what time.  Family showed up and told her to come here.  She states that he is at his baseline.  Is not acting abnormal.   Altered Mental Status      Past Medical History:  Diagnosis Date  . BPH (benign prostatic hypertrophy)     Patient Active Problem List   Diagnosis Date Noted  . Memory loss 03/19/2014  . BPH (benign prostatic hypertrophy) 03/19/2014  . Routine general medical examination at a health care facility 03/19/2014    No past surgical history on file.     No family history on file.  Social History   Tobacco Use  . Smoking status: Never Smoker  Substance Use Topics  . Alcohol use: No    Home Medications Prior to Admission medications   Medication Sig Start Date End Date Taking? Authorizing Provider  Calcium Carbonate (CALCIUM 600 PO) Take 600 mg by mouth 2 (two) times daily.    [provider]  Cholecalciferol (VITAMIN D3) 400 UNITS CAPS Take 400 mg by mouth daily.    [provider]  donepezil (ARICEPT) 5 MG tablet Take 1 tablet (5 mg total) by mouth at bedtime. Patient not taking: Reported on 01/07/2017 03/20/14   Myrlene Broker, MD  doxazosin (CARDURA) 4 MG tablet Take 4 mg by mouth daily.    [provider]  finasteride (PROSCAR) 5 MG tablet Take 5 mg by mouth at bedtime.    [provider]  glucosamine-chondroitin 500-400 MG tablet Take 1 tablet by mouth 2 (two) times daily.    [provider]  Multiple Vitamins-Minerals (MULTI FOR HIM 50+ PO) Take by mouth daily.    [provider]    Allergies     Aricept [donepezil]  Review of Systems   Review of Systems  Unable to perform ROS: Dementia    Physical Exam Updated Vital Signs BP (!) 163/83 (BP Location: Right Arm)   Pulse 78   Temp 97.8 F (36.6 C) (Oral)   Resp 16   Wt 68.5 kg   SpO2 95%   Physical Exam Vitals and nursing note reviewed.  Constitutional:      Appearance: He is well-developed and well-nourished.  HENT:     Head: Normocephalic.     Comments: Abrasion on the left eye    Mouth/Throat:     Mouth: Mucous membranes are moist.     Pharynx: Oropharynx is clear.  Eyes:     Pupils: Pupils are equal, round, and reactive to light.  Cardiovascular:     Rate and Rhythm: Normal rate.  Pulmonary:     Effort: Pulmonary effort is normal. No respiratory distress.  Abdominal:     General: Abdomen is flat. There is no distension.  Musculoskeletal:        General: No tenderness (No tenderness to palpation of C/C/L-spine, upper extremities, lower extremities, ribs or abdomen or pelvis.). Normal range of motion.     Cervical back: Normal range of motion.  Skin:    General: Skin is warm and dry.  Neurological:     General: No focal deficit present.     Mental Status: He is alert.     ED Results / Procedures / Treatments   Labs (all labs ordered are listed, but only abnormal results are displayed) Labs Reviewed - No data to display  EKG None  Radiology No results found.  Procedures Procedures (including critical care time)  Medications Ordered in ED Medications - No data to display  ED Course  I have reviewed the triage vital signs and the nursing notes.  Pertinent labs & imaging results that were available during my care of the patient were reviewed by me and considered in my medical decision making (see chart for details).    MDM Rules/Calculators/A&P                          Small abrasion left eye.  Up-to-date on shots.  Wife states he is at baseline and does not feel he needs any imaging at  this time.  No obvious traumatic injuries elsewhere.  Patient appears to be at his neurologic baseline.  He is not actually altered according to the wife this just is normal dementia.  She wishes to take him home at this time to work on get home health or other help at the house.  Final Clinical Impression(s) / ED Diagnoses Final diagnoses:  Abrasion  Dementia without behavioral disturbance, unspecified dementia type [facility])    Rx / DC Orders ED Discharge Orders         Ordered    Home Health        12/31/19 0008    Face-to-face encounter (required for Medicare/Medicaid patients)       Comments: I Marily Memos certify that this patient is under my care and that I, or a nurse practitioner or physician's assistant working with me, had a face-to-face encounter that meets the physician face-to-face encounter requirements with this patient on 12/31/2019. The encounter with the patient was in whole, or in part for the following medical condition(s) which is the primary reason for home health care (List medical condition): severe dementia, wanders. Further orders per PCP.   12/31/19 0008           Renna Kilmer, Barbara Cower, MD 12/31/19 [phone]

## 2020-02-05 ENCOUNTER — Encounter (HOSPITAL_COMMUNITY): Payer: Self-pay

## 2020-02-05 ENCOUNTER — Emergency Department (HOSPITAL_COMMUNITY)
Admission: EM | Admit: 2020-02-05 | Discharge: 2020-02-05 | Disposition: A | Discharge status: Home / Self Care | Payer: Medicare HMO | Attending: Emergency Medicine | Admitting: Emergency Medicine

## 2020-02-05 ENCOUNTER — Emergency Department (HOSPITAL_COMMUNITY): Payer: Medicare HMO

## 2020-02-05 ENCOUNTER — Other Ambulatory Visit: Payer: Self-pay

## 2020-02-05 DIAGNOSIS — R4182 Altered mental status, unspecified: Secondary | ICD-10-CM | POA: Diagnosis not present

## 2020-02-05 DIAGNOSIS — R1032 Left lower quadrant pain: Secondary | ICD-10-CM | POA: Diagnosis not present

## 2020-02-05 DIAGNOSIS — F0390 Unspecified dementia without behavioral disturbance: Secondary | ICD-10-CM | POA: Diagnosis not present

## 2020-02-05 LAB — URINALYSIS, COMPLETE (UACMP) WITH MICROSCOPIC
Bilirubin Urine: NEGATIVE
Glucose, UA: NEGATIVE mg/dL
Hgb urine dipstick: NEGATIVE
Ketones, ur: NEGATIVE mg/dL
Leukocytes,Ua: NEGATIVE
Nitrite: NEGATIVE
Protein, ur: NEGATIVE mg/dL
Specific Gravity, Urine: 1.018 (ref 1.005–1.030)
pH: 7 (ref 5.0–8.0)

## 2020-02-05 LAB — CBC WITH DIFFERENTIAL/PLATELET
Abs Immature Granulocytes: 0.01 10*3/uL (ref 0.00–0.07)
Basophils Absolute: 0.1 10*3/uL (ref 0.0–0.1)
Basophils Relative: 1 %
Eosinophils Absolute: 0.4 10*3/uL (ref 0.0–0.5)
Eosinophils Relative: 9 %
HCT: 39.4 % (ref 39.0–52.0)
Hemoglobin: 12.9 g/dL — ABNORMAL LOW (ref 13.0–17.0)
Immature Granulocytes: 0 %
Lymphocytes Relative: 30 %
Lymphs Abs: 1.4 10*3/uL (ref 0.7–4.0)
MCH: 30.9 pg (ref 26.0–34.0)
MCHC: 32.7 g/dL (ref 30.0–36.0)
MCV: 94.5 fL (ref 80.0–100.0)
Monocytes Absolute: 0.5 10*3/uL (ref 0.1–1.0)
Monocytes Relative: 12 %
Neutro Abs: 2.2 10*3/uL (ref 1.7–7.7)
Neutrophils Relative %: 48 %
Platelets: 215 10*3/uL (ref 150–400)
RBC: 4.17 MIL/uL — ABNORMAL LOW (ref 4.22–5.81)
RDW: 12.8 % (ref 11.5–15.5)
WBC: 4.6 10*3/uL (ref 4.0–10.5)
nRBC: 0 % (ref 0.0–0.2)

## 2020-02-05 LAB — COMPREHENSIVE METABOLIC PANEL
ALT: 18 U/L (ref 0–44)
AST: 23 U/L (ref 15–41)
Albumin: 4.3 g/dL (ref 3.5–5.0)
Alkaline Phosphatase: 65 U/L (ref 38–126)
Anion gap: 10 (ref 5–15)
BUN: 33 mg/dL — ABNORMAL HIGH (ref 8–23)
CO2: 28 mmol/L (ref 22–32)
Calcium: 9.6 mg/dL (ref 8.9–10.3)
Chloride: 102 mmol/L (ref 98–111)
Creatinine, Ser: 1.09 mg/dL (ref 0.61–1.24)
GFR, Estimated: >60 mL/min (ref >60)
Glucose, Bld: 94 mg/dL (ref 70–99)
Potassium: 4.1 mmol/L (ref 3.5–5.1)
Sodium: 140 mmol/L (ref 135–145)
Total Bilirubin: 0.6 mg/dL (ref 0.3–1.2)
Total Protein: 7.3 g/dL (ref 6.5–8.1)

## 2020-02-05 LAB — CBG MONITORING, ED: Glucose-Capillary: 80 mg/dL (ref 70–99)

## 2020-02-05 NOTE — ED Provider Notes (Signed)
Assumed care of patient at change of shift. Patient from home, at baseline now. Pending labs, concerned for UTI.  Physical Exam  BP (!) 148/61 (BP Location: Right Arm)   Pulse 62   Temp (!) 97.5 F (36.4 C) (Oral)   Resp 17   SpO2 98%   Physical Exam  ED Course/Procedures     Procedures  MDM  Labs unremarkable, plan is for dc home. Referrals placed for home health and transitions of care. Discussed plan with with who is agreeable with dc home at this time.        Jeannie Fend, PA-C 02/05/20 1833    Nicanor Alcon, April, MD 02/05/20 Carolanne Grumbling, April, MD 02/05/20 2355

## 2020-02-05 NOTE — Progress Notes (Signed)
02/05/2020 @ 9:49am  .. Transition of Care [facility]) - Emergency Department Mini Assessment   Patient Details  Name: Christopher Gross MRN: [id number] Date of Birth: 16-Feb-1924  Transition of Care [facility]) CM/SW Contact:    Christopher Gross, LCSWA Phone Number: 02/05/2020, 10:25 AM   Clinical Narrative: [facility] CM/CSW consulted with pt.  Pts wife/Christopher Gross is the person CSW spoke to.  Christopher Gross stated pt did not need any DME, pt was in need of HH.    Christopher Gross, MSW, LCSW-A Pronouns:  Christopher Gross, Her, Hers                  Christopher Gross Christopher Gross [phone]   Christopher Mini Assessment: What brought you to the Emergency Department? : Altered Mental Status  Barriers to Discharge: No Barriers Identified     Means of departure: Car  Interventions which prevented an admission or readmission: Home Health Consult or Services    Patient Contact and Communications Key Contact 1: [name]   Spoke with: Roselee Nova Date: 02/05/20,     Contact Phone Number: [phone] Call outcome: Pt is in need of HH.  Patient states their goals for this hospitalization and ongoing recovery are:: To get assistance with HH coming to the home. CMS Medicare.gov Compare Post Acute Care list provided to:: Patient Choice offered to / list presented to : Patient  Admission diagnosis:  Altered Mental Status Patient Active Problem List   Diagnosis Date Noted  . Memory loss 03/19/2014  . BPH (benign prostatic hypertrophy) 03/19/2014  . Routine general medical examination at a health care facility 03/19/2014   PCP:  Patient, No Pcp Per Pharmacy:   [id number] - [place], [place] - [address] AT [address] [place] [address] Phone: [phone] Fax: [phone]

## 2020-02-05 NOTE — ED Triage Notes (Signed)
Pt arrives EMS from home after wife called out stating pt is acting more confused than normal. EMS reports patient is known to be violent and aggressive with staff. Hx dementia

## 2020-02-05 NOTE — ED Provider Notes (Signed)
[facility]-EMERGENCY DEPT Provider Note   CSN: [id number] Arrival date & time: 02/05/20  0541     History Chief Complaint  Patient presents with  . Altered Mental Status    Christopher Gross is a 87 y.o. male with a history of BPH and dementia who presents to the emergency department by EMS from home with a chief complaint of altered mental status.  The history is provided by the patient's wife.  She went downstairs at approximately 4:20 AM, and the patient was dressed and was walking around downstairs.  He looked at his wife and said "bed".  He started to walk up the stairs with his wife behind him to make sure that he did not fall.  He got part way up stairs and then got down on his knees and began crawling the rest of the way up the stairs.  He continued to crawl up the stairs, almost to the landing, when he stops and laid down on the floor and "keeled over and seemed like he went to sleep".   His wife stated that she stood over him shook him, and called his name for about 30 minutes.  At one point, he moved his legs and feet under him a little bit in his knee towards his chest minimally.  She did not attempt to check his pulse or start CPR.  She notes that he did not feel hot to the touch.  After about 30 minutes when she could not get him to respond, she called EMS.  When EMS arrived, they pricked his finger and he "came up fighting and yelling no, no, no."  No tongue biting, shaking or jerking, or incontinence was noted.  She reports that he did have diarrhea 4 days ago after eating three fourths of a can of cake frosting.  No recent vomiting, fever, shortness of breath, cough.  She states that she is his primary caregiver.  They do not have an in-home aide or home health set up.  She is concerned because he is getting more confused, falling more, and wandering more.  She feels like she needs additional assistance to help care for her husband in the home.  She does have  follow-up with his urologist regularly and he was last seen last month.  However, she states that he no longer has a PCP.  Level 5 caveat secondary to dementia.  The history is provided by the patient, medical records and the spouse. The history is limited by the absence of a caregiver. No language interpreter was used.       Past Medical History:  Diagnosis Date  . BPH (benign prostatic hypertrophy)     Patient Active Problem List   Diagnosis Date Noted  . Memory loss 03/19/2014  . BPH (benign prostatic hypertrophy) 03/19/2014  . Routine general medical examination at a health care facility 03/19/2014    History reviewed. No pertinent surgical history.     No family history on file.  Social History   Tobacco Use  . Smoking status: Never Smoker  Substance Use Topics  . Alcohol use: No  . Drug use: Not Currently    Home Medications Prior to Admission medications   Medication Sig Start Date End Date Taking? Authorizing Provider  Calcium Carbonate (CALCIUM 600 PO) Take 600 mg by mouth 2 (two) times daily.    [provider]  Cholecalciferol (VITAMIN D3) 400 UNITS CAPS Take 400 mg by mouth daily.    [provider]  donepezil (ARICEPT) 5 MG tablet Take 1 tablet (5 mg total) by mouth at bedtime. Patient not taking: Reported on 01/07/2017 03/20/14   Myrlene Broker, MD  doxazosin (CARDURA) 4 MG tablet Take 4 mg by mouth daily.    [provider]  finasteride (PROSCAR) 5 MG tablet Take 5 mg by mouth at bedtime.    [provider]  glucosamine-chondroitin 500-400 MG tablet Take 1 tablet by mouth 2 (two) times daily.    [provider]  Multiple Vitamins-Minerals (MULTI FOR HIM 50+ PO) Take by mouth daily.    [provider]    Allergies    Aricept [donepezil]  Review of Systems   Review of Systems  Unable to perform ROS: Mental status change    Physical Exam Updated Vital Signs BP (!) 148/61 (BP Location:  Right Arm)   Pulse 62   Temp (!) 97.5 F (36.4 C) (Oral)   Resp 17   SpO2 98%   Physical Exam Vitals and nursing note reviewed.  Constitutional:      General: He is not in acute distress.    Appearance: He is well-developed. He is not ill-appearing, toxic-appearing or diaphoretic.     Comments: Pleasant. Cooperative. No acute distress.   HENT:     Head: Normocephalic.     Ears:     Comments: Hard of hearing.     Mouth/Throat:     Mouth: Mucous membranes are moist.  Eyes:     Extraocular Movements: Extraocular movements intact.     Conjunctiva/sclera: Conjunctivae normal.     Pupils: Pupils are equal, round, and reactive to light.  Cardiovascular:     Rate and Rhythm: Normal rate and regular rhythm.     Pulses: Normal pulses.     Heart sounds: Normal heart sounds. No murmur heard. No friction rub. No gallop.   Pulmonary:     Effort: Pulmonary effort is normal. No respiratory distress.     Breath sounds: No stridor. No wheezing, rhonchi or rales.  Chest:     Chest wall: No tenderness.  Abdominal:     General: There is no distension.     Palpations: Abdomen is soft. There is no mass.     Tenderness: There is abdominal tenderness. There is no right CVA tenderness, left CVA tenderness, guarding or rebound.     Hernia: No hernia is present.     Comments: Minimal discomfort to palpation of the left lower quadrant.  No rebound or guarding.  Musculoskeletal:     Cervical back: Neck supple.     Right lower leg: No edema.     Left lower leg: No edema.  Skin:    General: Skin is warm and dry.     Capillary Refill: Capillary refill takes less than 2 seconds.     Coloration: Skin is not jaundiced.     Findings: No bruising or rash.  Neurological:     Mental Status: He is alert.     Comments: Oriented to self only.   Psychiatric:        Behavior: Behavior normal.     ED Results / Procedures / Treatments   Labs (all labs ordered are listed, but only abnormal results are  displayed) Labs Reviewed  COMPREHENSIVE METABOLIC PANEL  CBC WITH DIFFERENTIAL/PLATELET  URINALYSIS, COMPLETE (UACMP) WITH MICROSCOPIC  CBG MONITORING, ED    EKG EKG Interpretation  Date/Time:  Thursday February 05 2020 06:50:31 EST Ventricular Rate:  58 PR Interval:  164 QRS Duration: 108 QT Interval:  418 QTC Calculation: 410 R Axis:   -11 Text Interpretation: Sinus bradycardia with marked sinus arrhythmia Moderate voltage criteria for LVH, may be normal variant ( R in aVL , Cornell product ) Confirmed by Nicanor Alcon, April ([id number]) on 02/05/2020 7:02:16 AM Also confirmed by Nicanor Alcon, April ([id number])  on 02/05/2020 7:03:02 AM   Radiology No results found.  Procedures Procedures   Medications Ordered in ED Medications - No data to display  ED Course  I have reviewed the triage vital signs and the nursing notes.  Pertinent labs & imaging results that were available during my care of the patient were reviewed by me and considered in my medical decision making (see chart for details).    MDM Rules/Calculators/A&P                          87 year old male with history of dementia and BPH who presents to the emergency department from home by EMS with altered mental status.  On arrival, patient is alert, moving all 4 extremities.  He is confused, but is oriented to self.  This seems to be his baseline.  Given concern for altered mental status, will check basic labs, head CT, EKG, and urinalysis.  Patient will need to be ambulated if he remains at his baseline.  As patient's wife had expressed that she needed additional assistance in their home self care for the patient, home health orders have been placed.  Patient care transferred to [facility] at the end of my shift to follow-up on labs, imaging, and reassessment. Patient presentation, ED course, and plan of care discussed with review of all pertinent labs and imaging. Please see his/her note for further details regarding further ED  course and disposition.   Final Clinical Impression(s) / ED Diagnoses Final diagnoses:  None    Rx / DC Orders ED Discharge Orders    None       Barkley Boards, PA-C 02/05/20 0730    Palumbo, April, MD 02/05/20 2352

## 2020-02-05 NOTE — Discharge Instructions (Addendum)
Referrals placed today for home health assessment.

## 2020-02-05 NOTE — Discharge Planning (Signed)
Christopher Gross J. Lucretia Roers, RN, BSN, [place] [phone] RNCM spoke with pt wife via telephone regarding discharge planning for Home Health Services. Offered pt medicare.gov list of home health agencies to choose from.  Pt chose [facility] render [facility]. [place] [facility] notified. Patient made aware that [facility]  will be in contact in 24-48 hours.  No DME needs identified at this time.

## 2020-02-13 ENCOUNTER — Ambulatory Visit (INDEPENDENT_AMBULATORY_CARE_PROVIDER_SITE_OTHER): Payer: Medicare HMO

## 2020-02-13 ENCOUNTER — Ambulatory Visit (INDEPENDENT_AMBULATORY_CARE_PROVIDER_SITE_OTHER): Payer: Medicare HMO | Admitting: Internal Medicine

## 2020-02-13 ENCOUNTER — Other Ambulatory Visit: Payer: Self-pay

## 2020-02-13 ENCOUNTER — Encounter: Payer: Self-pay | Admitting: Internal Medicine

## 2020-02-13 VITALS — BP 122/76 | HR 62 | Temp 98.1°F | Resp 16 | Ht 64.0 in | Wt 119.0 lb

## 2020-02-13 DIAGNOSIS — R058 Other specified cough: Secondary | ICD-10-CM | POA: Diagnosis not present

## 2020-02-13 DIAGNOSIS — D539 Nutritional anemia, unspecified: Secondary | ICD-10-CM | POA: Diagnosis not present

## 2020-02-13 DIAGNOSIS — Z0001 Encounter for general adult medical examination with abnormal findings: Secondary | ICD-10-CM

## 2020-02-13 DIAGNOSIS — K4090 Unilateral inguinal hernia, without obstruction or gangrene, not specified as recurrent: Secondary | ICD-10-CM | POA: Insufficient documentation

## 2020-02-13 DIAGNOSIS — Z0000 Encounter for general adult medical examination without abnormal findings: Secondary | ICD-10-CM

## 2020-02-13 LAB — CBC WITH DIFFERENTIAL/PLATELET
Basophils Absolute: 0.1 10*3/uL (ref 0.0–0.1)
Basophils Relative: 1.3 % (ref 0.0–3.0)
Eosinophils Absolute: 0.2 10*3/uL (ref 0.0–0.7)
Eosinophils Relative: 5.5 % — ABNORMAL HIGH (ref 0.0–5.0)
HCT: 38.8 % — ABNORMAL LOW (ref 39.0–52.0)
Hemoglobin: 13.3 g/dL (ref 13.0–17.0)
Lymphocytes Relative: 26 % (ref 12.0–46.0)
Lymphs Abs: 1.2 10*3/uL (ref 0.7–4.0)
MCHC: 34.2 g/dL (ref 30.0–36.0)
MCV: 91.5 fl (ref 78.0–100.0)
Monocytes Absolute: 0.7 10*3/uL (ref 0.1–1.0)
Monocytes Relative: 16.3 % — ABNORMAL HIGH (ref 3.0–12.0)
Neutro Abs: 2.3 10*3/uL (ref 1.4–7.7)
Neutrophils Relative %: 50.9 % (ref 43.0–77.0)
Platelets: 239 10*3/uL (ref 150.0–400.0)
RBC: 4.23 Mil/uL (ref 4.22–5.81)
RDW: 13.5 % (ref 11.5–15.5)
WBC: 4.4 10*3/uL (ref 4.0–10.5)

## 2020-02-13 LAB — IRON: Iron: 53 ug/dL (ref 42–165)

## 2020-02-13 LAB — FERRITIN: Ferritin: 199 ng/mL (ref 22.0–322.0)

## 2020-02-13 LAB — FOLATE: Folate: >23.6 ng/mL (ref >5.9)

## 2020-02-13 LAB — VITAMIN B12: Vitamin B-12: 665 pg/mL (ref 211–911)

## 2020-02-13 NOTE — Progress Notes (Signed)
Subjective:  Patient ID: Christopher Gross, male    DOB: Jun 13, 1924  Age: 87 y.o. MRN: [id number]  CC: Anemia, Annual Exam, Cough, and Abdominal Pain  This visit occurred during the SARS-CoV-2 public health emergency.  Safety protocols were in place, including screening questions prior to the visit, additional usage of staff PPE, and extensive cleaning of exam room while observing appropriate contact time as indicated for disinfecting solutions.   NEW TO ME  HPI JVION TURGEON presents for a CPX.  He is with his wife today and she tells most of the history.  She tells me that a week ago he was at home and she said he had a syncopal event where he slid to the ground.  He was seen in the emergency room and was found to be mildly anemic but his work-up was otherwise unremarkable.  He has had worsening memory but is not responding well to Aricept.  She also thinks he has intermittent dizziness.  She tells me he saw urologist about 3 weeks ago and was diagnosed with a left inguinal hernia.  At that time she was informed that nothing should be done about it but she thinks the hernia bothers him and she wants to have him evaluated for possible hernia surgery.  She tells me he has a chronic cough which is recently worsened and is rarely productive of clear phlegm.   Outpatient Medications Prior to Visit  Medication Sig Dispense Refill  . Calcium Carbonate (CALCIUM 600 PO) Take 600 mg by mouth 2 (two) times daily.    . finasteride (PROSCAR) 5 MG tablet Take 5 mg by mouth at bedtime.    . Multiple Vitamins-Minerals (MULTI FOR HIM 50+ PO) Take 1 tablet by mouth daily.    Marland Kitchen aspirin EC 81 MG tablet Take 81 mg by mouth daily. Swallow whole.    . donepezil (ARICEPT) 5 MG tablet Take 1 tablet (5 mg total) by mouth at bedtime. 30 tablet 3   No facility-administered medications prior to visit.    ROS Review of Systems  Constitutional: Negative.  Negative for appetite change, diaphoresis and fatigue.  HENT:  Negative.  Negative for trouble swallowing.   Eyes: Negative.   Respiratory: Positive for cough. Negative for chest tightness, shortness of breath and wheezing.   Cardiovascular: Negative.  Negative for chest pain and leg swelling.  Gastrointestinal: Negative for abdominal pain, diarrhea, nausea and vomiting.  Endocrine: Negative.   Genitourinary: Negative.  Negative for difficulty urinating, hematuria and urgency.  Musculoskeletal: Negative for arthralgias.  Skin: Negative for color change and rash.  Neurological: Negative for dizziness, weakness, numbness and headaches.  Hematological: Negative for adenopathy. Does not bruise/bleed easily.  Psychiatric/Behavioral: Positive for confusion, decreased concentration and sleep disturbance. Negative for dysphoric mood. The patient is not nervous/anxious.     Objective:  BP 122/76   Pulse 62   Temp 98.1 F (36.7 C) (Oral)   Resp 16   Ht 5\' 4"  (1.626 m)   Wt 119 lb (54 kg)   SpO2 97%   BMI 20.43 kg/m   BP Readings from Last 3 Encounters:  02/13/20 122/76  02/05/20 (!) 169/71  12/31/19 (!) 172/90    Wt Readings from Last 3 Encounters:  02/13/20 119 lb (54 kg)  12/30/19 151 lb (68.5 kg)  03/18/14 130 lb (59 kg)    Physical Exam Vitals reviewed.  Constitutional:      Appearance: He is well-developed.  HENT:     Mouth/Throat:  Mouth: Mucous membranes are moist.  Eyes:     General: No scleral icterus.    Extraocular Movements: Extraocular movements intact.     Conjunctiva/sclera: Conjunctivae normal.  Cardiovascular:     Rate and Rhythm: Normal rate and regular rhythm.     Pulses: Normal pulses.     Heart sounds: No murmur heard.   Pulmonary:     Effort: Pulmonary effort is normal.     Breath sounds: No stridor. No wheezing, rhonchi or rales.  Abdominal:     General: Abdomen is flat. There is no distension.     Palpations: Abdomen is soft.     Tenderness: There is no abdominal tenderness.     Hernia: A hernia is  present. Hernia is present in the left inguinal area. There is no hernia in the umbilical area, ventral area, right femoral area, left femoral area or right inguinal area.     Comments: Easily reducible left inguinal hernia  Musculoskeletal:     Cervical back: Neck supple.  Lymphadenopathy:     Cervical: No cervical adenopathy.  Neurological:     Mental Status: He is alert.     Lab Results  Component Value Date   WBC 4.4 02/13/2020   HGB 13.3 02/13/2020   HCT 38.8 (L) 02/13/2020   PLT 239.0 02/13/2020   GLUCOSE 94 02/05/2020   CHOL 220 (H) 03/18/2014   TRIG 84.0 03/18/2014   HDL 99.60 03/18/2014   LDLCALC 104 (H) 03/18/2014   ALT 18 02/05/2020   AST 23 02/05/2020   NA 140 02/05/2020   K 4.1 02/05/2020   CL 102 02/05/2020   CREATININE 1.09 02/05/2020   BUN 33 (H) 02/05/2020   CO2 28 02/05/2020    CT HEAD WO CONTRAST  Result Date: 02/05/2020 CLINICAL DATA:  87 year old male with increasing confusion. EXAM: CT HEAD WITHOUT CONTRAST TECHNIQUE: Contiguous axial images were obtained from the base of the skull through the vertex without intravenous contrast. COMPARISON:  Head CT 03/28/2019. FINDINGS: Brain: Hypodensity in the left thalamus appear stable from last year compatible with chronic small vessel ischemia. Comparatively mild for age patchy bilateral white matter hypodensity is stable. Mildly increased ventricle size throughout the brain from last year although appears to be ex vacuo in nature. No transependymal edema suspected. No midline shift, mass effect, or evidence of intracranial mass lesion. No acute intracranial hemorrhage identified. No cortically based acute infarct identified. Occasional chronic if any cortical encephalomalacia. Vascular: Extensive Calcified atherosclerosis at the skull base. No suspicious intracranial vascular hyperdensity. Skull: No acute osseous abnormality identified. Sinuses/Orbits: Visualized paranasal sinuses and mastoids are clear. Other: No  acute orbit or scalp soft tissue findings. Postoperative changes to the globes. Calcified scalp vessel atherosclerosis again noted. IMPRESSION: 1. No acute intracranial abnormality. 2. Evidence of chronic small vessel ischemia appears stable since last year, including in the left thalamus. Electronically Signed   By: Odessa Fleming M.D.   On: 02/05/2020 07:37   DG Chest 2 View  Result Date: 02/14/2020 CLINICAL DATA:  Chronic productive cough EXAM: CHEST - 2 VIEW COMPARISON:  01/07/2017 FINDINGS: Frontal and lateral views of the chest demonstrate an unremarkable cardiac silhouette. Chronic background scarring without focal airspace disease, effusion, or pneumothorax. No acute bony abnormalities. IMPRESSION: 1. Stable scarring, no acute process. Electronically Signed   By: Sharlet Salina M.D.   On: 02/14/2020 20:12     Assessment & Plan:   Shamir was seen today for anemia, annual exam, cough and abdominal pain.  Diagnoses and all orders for this visit:  Deficiency anemia- I will screen him for vitamin deficiencies. -     CBC with Differential/Platelet; Future -     Reticulocytes; Future -     Vitamin B1; Future -     Vitamin B12; Future -     Ferritin; Future -     Folate; Future -     Iron; Future -     Iron -     Folate -     Ferritin -     Vitamin B12 -     Vitamin B1 -     Reticulocytes -     CBC with Differential/Platelet  Cough with sputum- His chest x-ray is negative for mass or infiltrate. -     DG Chest 2 View; Future  Inguinal hernia of left side without obstruction or gangrene -     Ambulatory referral to General Surgery  Encounter for general adult medical examination with abnormal findings- Exam completed, labs reviewed, vaccines reviewed and updated, no cancer screenings are indicated, patient education was given.   I have discontinued Hutch J. Morefield's donepezil and aspirin EC. I am also having him maintain his Calcium Carbonate (CALCIUM 600 PO), Multiple Vitamins-Minerals  (MULTI FOR HIM 50+ PO), and finasteride.  No orders of the defined types were placed in this encounter.    Follow-up: Return in about 6 weeks (around 03/26/2020).  Sanda Linger, MD

## 2020-02-13 NOTE — Patient Instructions (Signed)
[employer] medicine (25th ed., pp. [phone]). [place], [place]: Elsevier.">  Anemia  Anemia is a condition in which there is not enough red blood cells or hemoglobin in the blood. Hemoglobin is a substance in red blood cells that carries oxygen. When you do not have enough red blood cells or hemoglobin (are anemic), your body cannot get enough oxygen and your organs may not work properly. As a result, you may feel very tired or have other problems. What are the causes? Common causes of anemia include:  Excessive bleeding. Anemia can be caused by excessive bleeding inside or outside the body, including bleeding from the intestines or from heavy menstrual periods in females.  Poor nutrition.  Long-lasting (chronic) kidney, thyroid, and liver disease.  Bone marrow disorders, spleen problems, and blood disorders.  Cancer and treatments for cancer.  HIV (human immunodeficiency virus) and AIDS (acquired immunodeficiency syndrome).  Infections, medicines, and autoimmune disorders that destroy red blood cells. What are the signs or symptoms? Symptoms of this condition include:  Minor weakness.  Dizziness.  Headache, or difficulties concentrating and sleeping.  Heartbeats that feel irregular or faster than normal (palpitations).  Shortness of breath, especially with exercise.  Pale skin, lips, and nails, or cold hands and feet.  Indigestion and nausea. Symptoms may occur suddenly or develop slowly. If your anemia is mild, you may not have symptoms. How is this diagnosed? This condition is diagnosed based on blood tests, your medical history, and a physical exam. In some cases, a test may be needed in which cells are removed from the soft tissue inside of a bone and looked at under a microscope (bone marrow biopsy). Your health care provider may also check your stool (feces) for blood and may do additional testing to look for the cause of your bleeding. Other tests may  include:  Imaging tests, such as a CT scan or MRI.  A procedure to see inside your esophagus and stomach (endoscopy).  A procedure to see inside your colon and rectum (colonoscopy). How is this treated? Treatment for this condition depends on the cause. If you continue to lose a lot of blood, you may need to be treated at a hospital. Treatment may include:  Taking supplements of iron, vitamin Q68, or folic acid.  Taking a hormone medicine (erythropoietin) that can help to stimulate red blood cell growth.  Having a blood transfusion. This may be needed if you lose a lot of blood.  Making changes to your diet.  Having surgery to remove your spleen. Follow these instructions at home:  Take over-the-counter and prescription medicines only as told by your health care provider.  Take supplements only as told by your health care provider.  Follow any diet instructions that you were given by your health care provider.  Keep all follow-up visits as told by your health care provider. This is important. Contact a health care provider if:  You develop new bleeding anywhere in the body. Get help right away if:  You are very weak.  You are short of breath.  You have pain in your abdomen or chest.  You are dizzy or feel faint.  You have trouble concentrating.  You have bloody stools, black stools, or tarry stools.  You vomit repeatedly or you vomit up blood. These symptoms may represent a serious problem that is an emergency. Do not wait to see if the symptoms will go away. Get medical help right away. Call your local emergency services (911 in the [place] Do not  drive yourself to the hospital. Summary  Anemia is a condition in which you do not have enough red blood cells or enough of a substance in your red blood cells that carries oxygen (hemoglobin).  Symptoms may occur suddenly or develop slowly.  If your anemia is mild, you may not have symptoms.  This condition is  diagnosed with blood tests, a medical history, and a physical exam. Other tests may be needed.  Treatment for this condition depends on the cause of the anemia. This information is not intended to replace advice given to you by your health care provider. Make sure you discuss any questions you have with your health care provider. Document Revised: 12/03/2018 Document Reviewed: 12/03/2018 Elsevier Patient Education  2021 Elsevier Inc.  

## 2020-02-16 DIAGNOSIS — Z0001 Encounter for general adult medical examination with abnormal findings: Secondary | ICD-10-CM | POA: Insufficient documentation

## 2020-02-16 DIAGNOSIS — Z7189 Other specified counseling: Secondary | ICD-10-CM | POA: Insufficient documentation

## 2020-02-20 LAB — RETICULOCYTES
ABS Retic: 42600 cells/uL (ref 25000–9000)
Retic Ct Pct: 1 %

## 2020-02-20 LAB — VITAMIN B1: Vitamin B1 (Thiamine): 27 nmol/L (ref 8–30)

## 2020-03-03 ENCOUNTER — Telehealth: Payer: Self-pay | Admitting: Internal Medicine

## 2020-03-03 NOTE — Telephone Encounter (Signed)
[name] an occupational therapist with [facility] calling, requesting verbal orders to extend OT for 1 week 5 [name]- [phone]  Okay to lvm

## 2020-03-04 NOTE — Telephone Encounter (Signed)
Verbal orders given via VM 

## 2020-03-08 ENCOUNTER — Telehealth: Payer: Self-pay | Admitting: Internal Medicine

## 2020-03-08 NOTE — Telephone Encounter (Signed)
Verbal orders given via VM 

## 2020-03-08 NOTE — Telephone Encounter (Signed)
   Joey from [facility] requesting verbal order for PT  PT 1w1, 2w2, 1w2 PONE [phone]

## 2020-04-21 ENCOUNTER — Other Ambulatory Visit: Payer: Self-pay

## 2020-04-21 ENCOUNTER — Encounter: Payer: Self-pay | Admitting: Internal Medicine

## 2020-04-21 ENCOUNTER — Ambulatory Visit (INDEPENDENT_AMBULATORY_CARE_PROVIDER_SITE_OTHER): Payer: Medicare HMO | Admitting: Internal Medicine

## 2020-04-21 VITALS — BP 126/74 | HR 65 | Temp 98.1°F | Ht 64.0 in | Wt 112.0 lb

## 2020-04-21 DIAGNOSIS — F411 Generalized anxiety disorder: Secondary | ICD-10-CM | POA: Diagnosis not present

## 2020-04-21 DIAGNOSIS — R058 Other specified cough: Secondary | ICD-10-CM | POA: Diagnosis not present

## 2020-04-21 MED ORDER — DEXTROMETHORPHAN POLISTIREX ER 30 MG/5ML PO SUER: 30.0000 mg | Freq: Two times a day (BID) | ORAL | 3 refills | Status: DC

## 2020-04-21 MED ORDER — CLONAZEPAM 1 MG PO TABS: 1.0000 mg | ORAL_TABLET | Freq: Every day | ORAL | 1 refills | Status: DC

## 2020-04-21 NOTE — Patient Instructions (Signed)

## 2020-04-21 NOTE — Progress Notes (Signed)
Subjective:  Patient ID: Christopher Gross, male    DOB: 05/18/1924  Age: 87 y.o. MRN: [id number]  CC: Cough  This visit occurred during the SARS-CoV-2 public health emergency.  Safety protocols were in place, including screening questions prior to the visit, additional usage of staff PPE, and extensive cleaning of exam room while observing appropriate contact time as indicated for disinfecting solutions.    HPI Christopher Gross presents for f/up -  His wife tells me that he gets anxious at night and he gets up and roams the house and makes a mess.  She would like for him to take something for insomnia and anxiety.  She tells me that his appetite is good.  He continues to cough and produces white phlegm.  She said she has not noticed any wheezing, shortness of breath, fever, or chills.  Outpatient Medications Prior to Visit  Medication Sig Dispense Refill  . Calcium Carbonate (CALCIUM 600 PO) Take 600 mg by mouth 2 (two) times daily.    . finasteride (PROSCAR) 5 MG tablet Take 5 mg by mouth at bedtime.    . Multiple Vitamins-Minerals (MULTI FOR HIM 50+ PO) Take 1 tablet by mouth daily.     No facility-administered medications prior to visit.    ROS Review of Systems  Constitutional: Negative for chills, fatigue and fever.  HENT: Negative.   Eyes: Negative.   Respiratory: Positive for cough. Negative for shortness of breath and wheezing.   Cardiovascular: Negative for chest pain, palpitations and leg swelling.  Gastrointestinal: Negative for abdominal pain, diarrhea and vomiting.  Endocrine: Negative.   Genitourinary: Negative.   Musculoskeletal: Negative.   Skin: Negative.   Neurological: Positive for speech difficulty. Negative for syncope.  Hematological: Negative.   Psychiatric/Behavioral: Positive for sleep disturbance. The patient is nervous/anxious.     Objective:  BP 126/74   Pulse 65   Temp 98.1 F (36.7 C) (Oral)   Ht 5\' 4"  (1.626 m)   Wt 112 lb (50.8 kg)   SpO2 96%    BMI 19.22 kg/m   BP Readings from Last 3 Encounters:  04/21/20 126/74  02/13/20 122/76  02/05/20 (!) 169/71    Wt Readings from Last 3 Encounters:  04/21/20 112 lb (50.8 kg)  02/13/20 119 lb (54 kg)  12/30/19 151 lb (68.5 kg)    Physical Exam Vitals reviewed.  HENT:     Mouth/Throat:     Mouth: Mucous membranes are moist.  Eyes:     General: No scleral icterus.    Conjunctiva/sclera: Conjunctivae normal.  Cardiovascular:     Rate and Rhythm: Normal rate and regular rhythm.     Heart sounds: No friction rub.  Pulmonary:     Breath sounds: No stridor. No wheezing, rhonchi or rales.  Abdominal:     General: Abdomen is flat.     Palpations: There is no mass.     Tenderness: There is no abdominal tenderness. There is no guarding.  Musculoskeletal:        General: Normal range of motion.     Cervical back: Neck supple.     Right lower leg: No edema.  Lymphadenopathy:     Cervical: No cervical adenopathy.  Skin:    General: Skin is warm.     Findings: No rash.  Neurological:     General: No focal deficit present.     Mental Status: Mental status is at baseline.  Psychiatric:        Speech: He  is noncommunicative.     Lab Results  Component Value Date   WBC 4.4 02/13/2020   HGB 13.3 02/13/2020   HCT 38.8 (L) 02/13/2020   PLT 239.0 02/13/2020   GLUCOSE 94 02/05/2020   CHOL 220 (H) 03/18/2014   TRIG 84.0 03/18/2014   HDL 99.60 03/18/2014   LDLCALC 104 (H) 03/18/2014   ALT 18 02/05/2020   AST 23 02/05/2020   NA 140 02/05/2020   K 4.1 02/05/2020   CL 102 02/05/2020   CREATININE 1.09 02/05/2020   BUN 33 (H) 02/05/2020   CO2 28 02/05/2020    CT HEAD WO CONTRAST  Result Date: 02/05/2020 CLINICAL DATA:  87 year old male with increasing confusion. EXAM: CT HEAD WITHOUT CONTRAST TECHNIQUE: Contiguous axial images were obtained from the base of the skull through the vertex without intravenous contrast. COMPARISON:  Head CT 03/28/2019. FINDINGS: Brain:  Hypodensity in the left thalamus appear stable from last year compatible with chronic small vessel ischemia. Comparatively mild for age patchy bilateral white matter hypodensity is stable. Mildly increased ventricle size throughout the brain from last year although appears to be ex vacuo in nature. No transependymal edema suspected. No midline shift, mass effect, or evidence of intracranial mass lesion. No acute intracranial hemorrhage identified. No cortically based acute infarct identified. Occasional chronic if any cortical encephalomalacia. Vascular: Extensive Calcified atherosclerosis at the skull base. No suspicious intracranial vascular hyperdensity. Skull: No acute osseous abnormality identified. Sinuses/Orbits: Visualized paranasal sinuses and mastoids are clear. Other: No acute orbit or scalp soft tissue findings. Postoperative changes to the globes. Calcified scalp vessel atherosclerosis again noted. IMPRESSION: 1. No acute intracranial abnormality. 2. Evidence of chronic small vessel ischemia appears stable since last year, including in the left thalamus. Electronically Signed   By: Odessa Fleming M.D.   On: 02/05/2020 07:37    Assessment & Plan:   Jermey was seen today for cough.  Diagnoses and all orders for this visit:  GAD (generalized anxiety disorder) -     clonazePAM (KLONOPIN) 1 MG tablet; Take 1 tablet (1 mg total) by mouth at bedtime.  Cough with sputum- He likely has chronic bronchitis.  Will treat with dextromethorphan. -     dextromethorphan (DELSYM) 30 MG/5ML liquid; Take 5 mLs (30 mg total) by mouth 2 (two) times daily.   I am having Christopher Gross start on dextromethorphan and clonazePAM. I am also having him maintain his Calcium Carbonate (CALCIUM 600 PO), Multiple Vitamins-Minerals (MULTI FOR HIM 50+ PO), and finasteride.  Meds ordered this encounter  Medications  . dextromethorphan (DELSYM) 30 MG/5ML liquid    Sig: Take 5 mLs (30 mg total) by mouth 2 (two) times daily.     Dispense:  148 mL    Refill:  3  . clonazePAM (KLONOPIN) 1 MG tablet    Sig: Take 1 tablet (1 mg total) by mouth at bedtime.    Dispense:  90 tablet    Refill:  1     Follow-up: Return in about 6 months (around 10/21/2020).  Sanda Linger, MD

## 2020-04-24 ENCOUNTER — Encounter: Payer: Self-pay | Admitting: Internal Medicine

## 2020-08-18 ENCOUNTER — Telehealth: Payer: Self-pay | Admitting: Internal Medicine

## 2020-08-18 NOTE — Telephone Encounter (Signed)
Left message for patient to call me back at [phone] to schedule Medicare Annual Wellness Visit   No hx of AWV eligible as of 01/09/09  Please schedule at anytime with [facility] Advisor if patient calls the office back.    45 Minutes appointment   Any questions, please call me at [phone]  

## 2020-08-26 ENCOUNTER — Ambulatory Visit: Payer: Medicare HMO

## 2020-11-26 ENCOUNTER — Inpatient Hospital Stay (HOSPITAL_COMMUNITY)
Admission: EM | Admit: 2020-11-26 | Discharge: 2020-12-09 | DRG: 178 | Disposition: A | Discharge status: Skilled Nursing Facility | Payer: Medicare HMO | Source: Skilled Nursing Facility | Attending: Internal Medicine | Admitting: Internal Medicine

## 2020-11-26 ENCOUNTER — Encounter (HOSPITAL_COMMUNITY): Payer: Self-pay

## 2020-11-26 ENCOUNTER — Other Ambulatory Visit: Payer: Self-pay

## 2020-11-26 ENCOUNTER — Emergency Department (HOSPITAL_COMMUNITY): Payer: Medicare HMO

## 2020-11-26 DIAGNOSIS — G9340 Encephalopathy, unspecified: Secondary | ICD-10-CM | POA: Diagnosis not present

## 2020-11-26 DIAGNOSIS — Z888 Allergy status to other drugs, medicaments and biological substances status: Secondary | ICD-10-CM

## 2020-11-26 DIAGNOSIS — R059 Cough, unspecified: Secondary | ICD-10-CM

## 2020-11-26 DIAGNOSIS — Z79899 Other long term (current) drug therapy: Secondary | ICD-10-CM

## 2020-11-26 DIAGNOSIS — R1310 Dysphagia, unspecified: Secondary | ICD-10-CM | POA: Diagnosis present

## 2020-11-26 DIAGNOSIS — R509 Fever, unspecified: Secondary | ICD-10-CM

## 2020-11-26 DIAGNOSIS — F0394 Unspecified dementia, unspecified severity, with anxiety: Secondary | ICD-10-CM | POA: Diagnosis present

## 2020-11-26 DIAGNOSIS — L03032 Cellulitis of left toe: Secondary | ICD-10-CM | POA: Diagnosis not present

## 2020-11-26 DIAGNOSIS — E860 Dehydration: Secondary | ICD-10-CM | POA: Diagnosis not present

## 2020-11-26 DIAGNOSIS — N400 Enlarged prostate without lower urinary tract symptoms: Secondary | ICD-10-CM | POA: Diagnosis present

## 2020-11-26 DIAGNOSIS — Z20822 Contact with and (suspected) exposure to covid-19: Secondary | ICD-10-CM | POA: Diagnosis present

## 2020-11-26 DIAGNOSIS — Z7189 Other specified counseling: Secondary | ICD-10-CM

## 2020-11-26 DIAGNOSIS — E876 Hypokalemia: Secondary | ICD-10-CM | POA: Diagnosis not present

## 2020-11-26 DIAGNOSIS — W19XXXA Unspecified fall, initial encounter: Secondary | ICD-10-CM

## 2020-11-26 DIAGNOSIS — J690 Pneumonitis due to inhalation of food and vomit: Principal | ICD-10-CM | POA: Diagnosis present

## 2020-11-26 DIAGNOSIS — Y92009 Unspecified place in unspecified non-institutional (private) residence as the place of occurrence of the external cause: Secondary | ICD-10-CM

## 2020-11-26 DIAGNOSIS — Z66 Do not resuscitate: Secondary | ICD-10-CM | POA: Diagnosis not present

## 2020-11-26 DIAGNOSIS — I10 Essential (primary) hypertension: Secondary | ICD-10-CM | POA: Diagnosis present

## 2020-11-26 DIAGNOSIS — L600 Ingrowing nail: Secondary | ICD-10-CM | POA: Diagnosis present

## 2020-11-26 DIAGNOSIS — Z781 Physical restraint status: Secondary | ICD-10-CM

## 2020-11-26 DIAGNOSIS — L089 Local infection of the skin and subcutaneous tissue, unspecified: Secondary | ICD-10-CM | POA: Diagnosis present

## 2020-11-26 DIAGNOSIS — S51012A Laceration without foreign body of left elbow, initial encounter: Secondary | ICD-10-CM | POA: Diagnosis present

## 2020-11-26 DIAGNOSIS — R9389 Abnormal findings on diagnostic imaging of other specified body structures: Secondary | ICD-10-CM

## 2020-11-26 DIAGNOSIS — J189 Pneumonia, unspecified organism: Secondary | ICD-10-CM

## 2020-11-26 DIAGNOSIS — F0390 Unspecified dementia without behavioral disturbance: Secondary | ICD-10-CM | POA: Diagnosis present

## 2020-11-26 DIAGNOSIS — W1830XA Fall on same level, unspecified, initial encounter: Secondary | ICD-10-CM | POA: Diagnosis present

## 2020-11-26 DIAGNOSIS — F05 Delirium due to known physiological condition: Secondary | ICD-10-CM | POA: Diagnosis present

## 2020-11-26 DIAGNOSIS — R531 Weakness: Secondary | ICD-10-CM

## 2020-11-26 DIAGNOSIS — Z515 Encounter for palliative care: Secondary | ICD-10-CM

## 2020-11-26 DIAGNOSIS — E870 Hyperosmolality and hypernatremia: Secondary | ICD-10-CM | POA: Diagnosis not present

## 2020-11-26 HISTORY — DX: Unspecified dementia, unspecified severity, without behavioral disturbance, psychotic disturbance, mood disturbance, and anxiety: F03.90

## 2020-11-26 LAB — CBC
HCT: 35 % — ABNORMAL LOW (ref 39.0–52.0)
Hemoglobin: 11.5 g/dL — ABNORMAL LOW (ref 13.0–17.0)
MCH: 30.1 pg (ref 26.0–34.0)
MCHC: 32.9 g/dL (ref 30.0–36.0)
MCV: 91.6 fL (ref 80.0–100.0)
Platelets: 226 10*3/uL (ref 150–400)
RBC: 3.82 MIL/uL — ABNORMAL LOW (ref 4.22–5.81)
RDW: 13.4 % (ref 11.5–15.5)
WBC: 5.4 10*3/uL (ref 4.0–10.5)
nRBC: 0 % (ref 0.0–0.2)

## 2020-11-26 LAB — BASIC METABOLIC PANEL
Anion gap: 7 (ref 5–15)
BUN: 36 mg/dL — ABNORMAL HIGH (ref 8–23)
CO2: 27 mmol/L (ref 22–32)
Calcium: 9.2 mg/dL (ref 8.9–10.3)
Chloride: 102 mmol/L (ref 98–111)
Creatinine, Ser: 1.18 mg/dL (ref 0.61–1.24)
GFR, Estimated: 57 mL/min — ABNORMAL LOW (ref >60)
Glucose, Bld: 99 mg/dL (ref 70–99)
Potassium: 3.9 mmol/L (ref 3.5–5.1)
Sodium: 136 mmol/L (ref 135–145)

## 2020-11-26 MED ORDER — LORAZEPAM 2 MG/ML IJ SOLN
0.5000 mg | Freq: Once | INTRAMUSCULAR | Status: AC
  Administered 2020-11-27: 0.5 mg via INTRAVENOUS
  Filled 2020-11-26: qty 1

## 2020-11-26 MED ORDER — ACETAMINOPHEN 650 MG RE SUPP: 650.0000 mg | Freq: Four times a day (QID) | RECTAL | Status: DC | PRN

## 2020-11-26 MED ORDER — SODIUM CHLORIDE 0.9 % IV SOLN
2.0000 g | INTRAVENOUS | Status: DC
  Administered 2020-11-27: 2 g via INTRAVENOUS
  Filled 2020-11-26 (×3): qty 20

## 2020-11-26 MED ORDER — PIPERACILLIN-TAZOBACTAM 3.375 G IVPB 30 MIN
3.3750 g | Freq: Once | INTRAVENOUS | Status: AC
  Administered 2020-11-26: 3.375 g via INTRAVENOUS
  Filled 2020-11-26: qty 50

## 2020-11-26 MED ORDER — METRONIDAZOLE 500 MG PO TABS: 500.0000 mg | ORAL_TABLET | Freq: Two times a day (BID) | ORAL | Status: DC

## 2020-11-26 MED ORDER — ONDANSETRON HCL 4 MG/2ML IJ SOLN: 4.0000 mg | Freq: Four times a day (QID) | INTRAMUSCULAR | Status: DC | PRN

## 2020-11-26 MED ORDER — ONDANSETRON HCL 4 MG PO TABS: 4.0000 mg | ORAL_TABLET | Freq: Four times a day (QID) | ORAL | Status: DC | PRN

## 2020-11-26 MED ORDER — ENOXAPARIN SODIUM 30 MG/0.3ML IJ SOSY
30.0000 mg | PREFILLED_SYRINGE | INTRAMUSCULAR | Status: DC
  Administered 2020-11-27 – 2020-12-09 (×13): 30 mg via SUBCUTANEOUS
  Filled 2020-11-26 (×13): qty 0.3

## 2020-11-26 MED ORDER — ACETAMINOPHEN 325 MG PO TABS
650.0000 mg | ORAL_TABLET | Freq: Four times a day (QID) | ORAL | Status: DC | PRN
  Administered 2020-11-27 – 2020-12-04 (×3): 650 mg via ORAL
  Filled 2020-11-26 (×4): qty 2

## 2020-11-26 NOTE — H&P (Signed)
History and Physical    Christopher Gross DJM:[id number] DOB: [birth year] DOA: 11/26/2020  PCP: Etta Grandchild, MD  Patient coming from: Home  I have personally briefly reviewed patient's old medical records in [facility]  Chief Complaint: Fall  HPI: Christopher Gross is a 87 y.o. male with medical history significant of BPH, dementia.  Pt presents to ED for mechanical fall.  Had fall at home this evening, hit head.  Skin tear to L elbow.  Unknown if LOC.  No CP, no SOB. No fevers/chills.  Has been having L big toe pain.  Family notes that he has ingrown toe nail and they are concerned about infection.   ED Course: No obvious major injury from fall on work up.  Does have ? Of osteomyelitis of L great distal phalynx on X ray though.  MRI recd.  Pt started on zosyn in ED.  During ED stay, became more confused.  Got restraints and ativan to prevent falling out of bed.  Looks like anxiety / confusion / sundowning is chronic for patient as described in his PCPs note from April.   Review of Systems: Pt confused, unable to provide any meaningful history by time of my exam.  Past Medical History:  Diagnosis Date   BPH (benign prostatic hypertrophy)    Dementia (HCC)     History reviewed. No pertinent surgical history.   reports that he has never smoked. He has never used smokeless tobacco. He reports that he does not currently use drugs. He reports that he does not drink alcohol.  Allergies  Allergen Reactions   Aricept [Donepezil] Other (See Comments)    History reviewed. No pertinent family history. Pt demented and unable to provide any history.  Prior to Admission medications   Medication Sig Start Date End Date Taking? Authorizing Provider  Calcium Carbonate (CALCIUM 600 PO) Take 600 mg by mouth 2 (two) times daily.    [provider]  clonazePAM (KLONOPIN) 1 MG tablet Take 1 tablet (1 mg total) by mouth at bedtime. 04/21/20   Etta Grandchild, MD   dextromethorphan (DELSYM) 30 MG/5ML liquid Take 5 mLs (30 mg total) by mouth 2 (two) times daily. 04/21/20   Etta Grandchild, MD  finasteride (PROSCAR) 5 MG tablet Take 5 mg by mouth at bedtime.    [provider]  Multiple Vitamins-Minerals (MULTI FOR HIM 50+ PO) Take 1 tablet by mouth daily.    [provider]    Physical Exam: Vitals:   11/26/20 2153  BP: (!) 188/94  Pulse: 70  Resp: 16  Temp: (!) 97.3 F (36.3 C)  TempSrc: Oral  SpO2: 98%  Weight: 49.9 kg  Height: 5\' 4"  (1.626 m)    Constitutional: Confused, elderly, frail Eyes: PERRL, lids and conjunctivae normal ENMT: Mucous membranes are moist. Posterior pharynx clear of any exudate or lesions.Normal dentition.  Neck: normal, supple, no masses, no thyromegaly Respiratory: clear to auscultation bilaterally, no wheezing, no crackles. Normal respiratory effort. No accessory muscle use.  Cardiovascular: Regular rate and rhythm, no murmurs / rubs / gallops. No extremity edema. 2+ pedal pulses. No carotid bruits.  Abdomen: no tenderness, no masses palpated. No hepatosplenomegaly. Bowel sounds positive.  Musculoskeletal: no clubbing / cyanosis. No joint deformity upper and lower extremities. Good ROM, no contractures. Normal muscle tone.  Skin:  Neurologic: MAE Psychiatric: Confused, not oriented, not following commands.   Labs on Admission: I have personally reviewed following labs and imaging studies  CBC: Recent  Labs  Lab 11/26/20 2249  WBC 5.4  HGB 11.5*  HCT 35.0*  MCV 91.6  PLT 226   Basic Metabolic Panel: Recent Labs  Lab 11/26/20 2249  NA 136  K 3.9  CL 102  CO2 27  GLUCOSE 99  BUN 36*  CREATININE 1.18  CALCIUM 9.2   GFR: Estimated Creatinine Clearance: 27 mL/min (by C-G formula based on SCr of 1.18 mg/dL). Liver Function Tests: No results for input(s): AST, ALT, ALKPHOS, BILITOT, PROT, ALBUMIN in the last 168 hours. No results for input(s): LIPASE, AMYLASE in the last 168  hours. No results for input(s): AMMONIA in the last 168 hours. Coagulation Profile: No results for input(s): INR, PROTIME in the last 168 hours. Cardiac Enzymes: No results for input(s): CKTOTAL, CKMB, CKMBINDEX, TROPONINI in the last 168 hours. BNP (last 3 results) No results for input(s): PROBNP in the last 8760 hours. HbA1C: No results for input(s): HGBA1C in the last 72 hours. CBG: No results for input(s): GLUCAP in the last 168 hours. Lipid Profile: No results for input(s): CHOL, HDL, LDLCALC, TRIG, CHOLHDL, LDLDIRECT in the last 72 hours. Thyroid Function Tests: No results for input(s): TSH, T4TOTAL, FREET4, T3FREE, THYROIDAB in the last 72 hours. Anemia Panel: No results for input(s): VITAMINB12, FOLATE, FERRITIN, TIBC, IRON, RETICCTPCT in the last 72 hours. Urine analysis:    Component Value Date/Time   COLORURINE YELLOW 02/05/2020 0757   APPEARANCEUR CLEAR 02/05/2020 0757   LABSPEC 1.018 02/05/2020 0757   PHURINE 7.0 02/05/2020 0757   GLUCOSEU NEGATIVE 02/05/2020 0757   HGBUR NEGATIVE 02/05/2020 0757   BILIRUBINUR NEGATIVE 02/05/2020 0757   KETONESUR NEGATIVE 02/05/2020 0757   PROTEINUR NEGATIVE 02/05/2020 0757   NITRITE NEGATIVE 02/05/2020 0757   LEUKOCYTESUR NEGATIVE 02/05/2020 0757    Radiological Exams on Admission: CT Head Wo Contrast  Result Date: 11/26/2020 CLINICAL DATA:  Witnessed fall, head and neck trauma EXAM: CT HEAD WITHOUT CONTRAST CT CERVICAL SPINE WITHOUT CONTRAST TECHNIQUE: Multidetector CT imaging of the head and cervical spine was performed following the standard protocol without intravenous contrast. Multiplanar CT image reconstructions of the cervical spine were also generated. COMPARISON:  03/28/2019 FINDINGS: CT HEAD FINDINGS Brain: Normal anatomic configuration. Parenchymal volume loss is commensurate with the patient's age. Mild periventricular white matter changes are present likely reflecting the sequela of small vessel ischemia. No abnormal  intra or extra-axial mass lesion or fluid collection. No abnormal mass effect or midline shift. No evidence of acute intracranial hemorrhage or infarct. Mild ventriculomegaly is stable. Cerebellum unremarkable. Vascular: No asymmetric hyperdense vasculature at the skull base. Skull: Intact Sinuses/Orbits: Paranasal sinuses are clear. Ocular lenses have been removed. Orbits are otherwise unremarkable. Other: Mastoid air cells and middle ear cavities are clear. CT CERVICAL SPINE FINDINGS Alignment: Normal cervical lordosis. Skull base and vertebrae: Craniocervical alignment is normal. The atlantodental interval is not widened. Degenerative changes are noted at the atlantodental articulation. No acute fracture of the cervical spine. Vertebral body height is preserved. Soft tissues and spinal canal: No prevertebral fluid or swelling. No visible canal hematoma. Disc levels: There is diffuse intervertebral disc space narrowing and endplate remodeling throughout the cervical spine in keeping with severe degenerative disc disease. The prevertebral soft tissues are not thickened. Review of the axial images demonstrates no significant spinal canal stenosis. Multilevel facet arthrosis results in multilevel mild neuroforaminal narrowing, most severe on the right at C3-4. Upper chest: A sub solid spiculated density is seen within the visualized right apex, incompletely evaluated on this examination and  indeterminate. Other: None IMPRESSION: No acute intracranial injury.  No calvarial fracture. No acute fracture or listhesis of the cervical spine. Spiculated density within the visualized right upper lobe, incompletely evaluated. This would be better assessed with dedicated imaging if indicated. Electronically Signed   By: Helyn Numbers M.D.   On: 11/26/2020 23:31   CT Cervical Spine Wo Contrast  Result Date: 11/26/2020 CLINICAL DATA:  Witnessed fall, head and neck trauma EXAM: CT HEAD WITHOUT CONTRAST CT CERVICAL SPINE  WITHOUT CONTRAST TECHNIQUE: Multidetector CT imaging of the head and cervical spine was performed following the standard protocol without intravenous contrast. Multiplanar CT image reconstructions of the cervical spine were also generated. COMPARISON:  03/28/2019 FINDINGS: CT HEAD FINDINGS Brain: Normal anatomic configuration. Parenchymal volume loss is commensurate with the patient's age. Mild periventricular white matter changes are present likely reflecting the sequela of small vessel ischemia. No abnormal intra or extra-axial mass lesion or fluid collection. No abnormal mass effect or midline shift. No evidence of acute intracranial hemorrhage or infarct. Mild ventriculomegaly is stable. Cerebellum unremarkable. Vascular: No asymmetric hyperdense vasculature at the skull base. Skull: Intact Sinuses/Orbits: Paranasal sinuses are clear. Ocular lenses have been removed. Orbits are otherwise unremarkable. Other: Mastoid air cells and middle ear cavities are clear. CT CERVICAL SPINE FINDINGS Alignment: Normal cervical lordosis. Skull base and vertebrae: Craniocervical alignment is normal. The atlantodental interval is not widened. Degenerative changes are noted at the atlantodental articulation. No acute fracture of the cervical spine. Vertebral body height is preserved. Soft tissues and spinal canal: No prevertebral fluid or swelling. No visible canal hematoma. Disc levels: There is diffuse intervertebral disc space narrowing and endplate remodeling throughout the cervical spine in keeping with severe degenerative disc disease. The prevertebral soft tissues are not thickened. Review of the axial images demonstrates no significant spinal canal stenosis. Multilevel facet arthrosis results in multilevel mild neuroforaminal narrowing, most severe on the right at C3-4. Upper chest: A sub solid spiculated density is seen within the visualized right apex, incompletely evaluated on this examination and indeterminate. Other:  None IMPRESSION: No acute intracranial injury.  No calvarial fracture. No acute fracture or listhesis of the cervical spine. Spiculated density within the visualized right upper lobe, incompletely evaluated. This would be better assessed with dedicated imaging if indicated. Electronically Signed   By: Helyn Numbers M.D.   On: 11/26/2020 23:31   DG Foot Complete Left  Result Date: 11/26/2020 CLINICAL DATA:  Ingrown infected big toe.  Redness and swelling. EXAM: LEFT FOOT - COMPLETE 3+ VIEW COMPARISON:  None. FINDINGS: Vascular calcifications are identified. There is rare furcation of mild irregularity of the cortex at the base of the distal first phalanx. No other abnormalities. IMPRESSION: Osteomyelitis not excluded in the proximal aspect of the distal first phalanx. Recommend MRI for further assessment. Electronically Signed   By: Gerome Sam III M.D.   On: 11/26/2020 22:45    EKG: Independently reviewed.  Assessment/Plan Principal Problem:   Infection of great toe Active Problems:   Acute encephalopathy   Dementia (HCC)    Infection of L great toe - LE wound pathway Getting zosyn in ED Putting on rocephin + flagyl Check MRSA PCR nares (add vanc if positive) X ray equivocal for osteomyelitis MRI of foot ordered for AM Acute encephalopathy on top of chronic dementia - Seems like sundowning is frequent / chronic for patient as described in his PCPs note from April this year Got ativan Add haldol if absolutely needed HTN - Hydralazine PRN  DVT prophylaxis: Lovenox Code Status: Full Code for now Family Communication: No family in room Disposition Plan: Home after Osteomyelitis work up and treatment Consults called: None Admission status: Place in obs    Rumaysa Sabatino M. DO [facility]  How to contact the [facility] Attending or Consulting provider 7A - 7P or covering provider during after hours 7P -7A, for this patient?  Check the care team in [facility] and look for a)  attending/consulting [facility] provider listed and b) the [facility] team listed Log into [email or web address]  Am[employer] Physician Scheduling and messaging for groups and whole hospitals  On call and physician scheduling software for group practices, residents, hospitalists and other medical providers for call, clinic, rotation and shift schedules. On[employer] is a hospital-wide system for scheduling doctors and paging doctors on call. EasyPlot is for scientific plotting and data analysis.  [email or web address]  and use [facility]'s universal password to access. If you do not have the password, please contact the hospital operator.  Locate the [facility] provider you are looking for under [facility] and page to a number that you can be directly reached. If you still have difficulty reaching the provider, please page the [facility] (Director on Call) for the Hospitalists listed on amion for assistance.  11/26/2020, 11:56 PM

## 2020-11-26 NOTE — ED Provider Notes (Signed)
[facility]-EMERGENCY DEPT Provider Note   CSN: [id number] Arrival date & time: 11/26/20  2146     History Chief Complaint  Patient presents with  . Fall    [address] Christopher Gross is a 87 y.o. male.   Fall   Patient presented to the ED for evaluation of a fall.  According to EMS report patient had a fall at home.  He struck his head.  Patient sustained a skin tear to his left elbow.  Unknown if he lost consciousness.  Patient is denying any complaints of chest pain or shortness of breath.  He denies any fevers or chills.  He also has been complaining of discomfort of his left big toe.  Family noted that he has had an ingrown toe and they are concerned about infection developing.  Past Medical History:  Diagnosis Date  . BPH (benign prostatic hypertrophy)     Patient Active Problem List   Diagnosis Date Noted  . GAD (generalized anxiety disorder) 04/21/2020  . Encounter for general adult medical examination with abnormal findings 02/16/2020  . Deficiency anemia 02/13/2020  . Cough with sputum 02/13/2020  . Inguinal hernia of left side without obstruction or gangrene 02/13/2020  . BPH (benign prostatic hypertrophy) 03/19/2014  . Routine general medical examination at a health care facility 03/19/2014    History reviewed. No pertinent surgical history.     History reviewed. No pertinent family history.  Social History   Tobacco Use  . Smoking status: Never  . Smokeless tobacco: Never  Substance Use Topics  . Alcohol use: No  . Drug use: Not Currently    Home Medications Prior to Admission medications   Medication Sig Start Date End Date Taking? Authorizing Provider  Calcium Carbonate (CALCIUM 600 PO) Take 600 mg by mouth 2 (two) times daily.    [provider]  clonazePAM (KLONOPIN) 1 MG tablet Take 1 tablet (1 mg total) by mouth at bedtime. 04/21/20   Etta Grandchild, MD  dextromethorphan (DELSYM) 30 MG/5ML liquid Take 5 mLs (30 mg total)  by mouth 2 (two) times daily. 04/21/20   Etta Grandchild, MD  finasteride (PROSCAR) 5 MG tablet Take 5 mg by mouth at bedtime.    [provider]  Multiple Vitamins-Minerals (MULTI FOR HIM 50+ PO) Take 1 tablet by mouth daily.    [provider]    Allergies    Aricept [donepezil]  Review of Systems   Review of Systems  All other systems reviewed and are negative.  Physical Exam Updated Vital Signs BP (!) 188/94 (BP Location: Left Arm)   Pulse 70   Temp (!) 97.3 F (36.3 C) (Oral)   Resp 16   Ht 1.626 m (5\' 4" )   Wt 49.9 kg   SpO2 98%   BMI 18.88 kg/m   Physical Exam Vitals and nursing note reviewed.  Constitutional:      General: He is not in acute distress.    Appearance: He is well-developed.  HENT:     Head: Normocephalic and atraumatic.     Right Ear: External ear normal.     Left Ear: External ear normal.  Eyes:     General: No scleral icterus.       Right eye: No discharge.        Left eye: No discharge.     Conjunctiva/sclera: Conjunctivae normal.  Neck:     Trachea: No tracheal deviation.  Cardiovascular:     Rate and Rhythm: Normal  rate and regular rhythm.  Pulmonary:     Effort: Pulmonary effort is normal. No respiratory distress.     Breath sounds: Normal breath sounds. No stridor. No wheezing or rales.  Abdominal:     General: Bowel sounds are normal. There is no distension.     Palpations: Abdomen is soft.     Tenderness: There is no abdominal tenderness. There is no guarding or rebound.  Musculoskeletal:        General: No tenderness or deformity.     Cervical back: Normal and neck supple.     Thoracic back: Normal.     Lumbar back: Normal.     Comments: Skin tear noted left elbow, no tenderness palpation of the shoulders elbows wrists hips ankles or knees, mild tenderness palpation left big toe, mild erythema noted, no purulent drainage, no lymphangitic streaking  Skin:    General: Skin is warm and dry.     Findings: No rash.   Neurological:     General: No focal deficit present.     Mental Status: He is alert.     Cranial Nerves: No cranial nerve deficit (no facial droop, extraocular movements intact, no slurred speech).     Sensory: No sensory deficit.     Motor: No abnormal muscle tone or seizure activity.     Coordination: Coordination normal.  Psychiatric:        Mood and Affect: Mood normal.    ED Results / Procedures / Treatments   Labs (all labs ordered are listed, but only abnormal results are displayed) Labs Reviewed  CBC - Abnormal; Notable for the following components:      Result Value   RBC 3.82 (*)    Hemoglobin 11.5 (*)    HCT 35.0 (*)    All other components within normal limits  BASIC METABOLIC PANEL - Abnormal; Notable for the following components:   BUN 36 (*)    GFR, Estimated 57 (*)    All other components within normal limits    EKG None  Radiology CT Head Wo Contrast  Result Date: 11/26/2020 CLINICAL DATA:  Witnessed fall, head and neck trauma EXAM: CT HEAD WITHOUT CONTRAST CT CERVICAL SPINE WITHOUT CONTRAST TECHNIQUE: Multidetector CT imaging of the head and cervical spine was performed following the standard protocol without intravenous contrast. Multiplanar CT image reconstructions of the cervical spine were also generated. COMPARISON:  03/28/2019 FINDINGS: CT HEAD FINDINGS Brain: Normal anatomic configuration. Parenchymal volume loss is commensurate with the patient's age. Mild periventricular white matter changes are present likely reflecting the sequela of small vessel ischemia. No abnormal intra or extra-axial mass lesion or fluid collection. No abnormal mass effect or midline shift. No evidence of acute intracranial hemorrhage or infarct. Mild ventriculomegaly is stable. Cerebellum unremarkable. Vascular: No asymmetric hyperdense vasculature at the skull base. Skull: Intact Sinuses/Orbits: Paranasal sinuses are clear. Ocular lenses have been removed. Orbits are otherwise  unremarkable. Other: Mastoid air cells and middle ear cavities are clear. CT CERVICAL SPINE FINDINGS Alignment: Normal cervical lordosis. Skull base and vertebrae: Craniocervical alignment is normal. The atlantodental interval is not widened. Degenerative changes are noted at the atlantodental articulation. No acute fracture of the cervical spine. Vertebral body height is preserved. Soft tissues and spinal canal: No prevertebral fluid or swelling. No visible canal hematoma. Disc levels: There is diffuse intervertebral disc space narrowing and endplate remodeling throughout the cervical spine in keeping with severe degenerative disc disease. The prevertebral soft tissues are not thickened. Review of the  axial images demonstrates no significant spinal canal stenosis. Multilevel facet arthrosis results in multilevel mild neuroforaminal narrowing, most severe on the right at C3-4. Upper chest: A sub solid spiculated density is seen within the visualized right apex, incompletely evaluated on this examination and indeterminate. Other: None IMPRESSION: No acute intracranial injury.  No calvarial fracture. No acute fracture or listhesis of the cervical spine. Spiculated density within the visualized right upper lobe, incompletely evaluated. This would be better assessed with dedicated imaging if indicated. Electronically Signed   By: Helyn Numbers M.D.   On: 11/26/2020 23:31   CT Cervical Spine Wo Contrast  Result Date: 11/26/2020 CLINICAL DATA:  Witnessed fall, head and neck trauma EXAM: CT HEAD WITHOUT CONTRAST CT CERVICAL SPINE WITHOUT CONTRAST TECHNIQUE: Multidetector CT imaging of the head and cervical spine was performed following the standard protocol without intravenous contrast. Multiplanar CT image reconstructions of the cervical spine were also generated. COMPARISON:  03/28/2019 FINDINGS: CT HEAD FINDINGS Brain: Normal anatomic configuration. Parenchymal volume loss is commensurate with the patient's age.  Mild periventricular white matter changes are present likely reflecting the sequela of small vessel ischemia. No abnormal intra or extra-axial mass lesion or fluid collection. No abnormal mass effect or midline shift. No evidence of acute intracranial hemorrhage or infarct. Mild ventriculomegaly is stable. Cerebellum unremarkable. Vascular: No asymmetric hyperdense vasculature at the skull base. Skull: Intact Sinuses/Orbits: Paranasal sinuses are clear. Ocular lenses have been removed. Orbits are otherwise unremarkable. Other: Mastoid air cells and middle ear cavities are clear. CT CERVICAL SPINE FINDINGS Alignment: Normal cervical lordosis. Skull base and vertebrae: Craniocervical alignment is normal. The atlantodental interval is not widened. Degenerative changes are noted at the atlantodental articulation. No acute fracture of the cervical spine. Vertebral body height is preserved. Soft tissues and spinal canal: No prevertebral fluid or swelling. No visible canal hematoma. Disc levels: There is diffuse intervertebral disc space narrowing and endplate remodeling throughout the cervical spine in keeping with severe degenerative disc disease. The prevertebral soft tissues are not thickened. Review of the axial images demonstrates no significant spinal canal stenosis. Multilevel facet arthrosis results in multilevel mild neuroforaminal narrowing, most severe on the right at C3-4. Upper chest: A sub solid spiculated density is seen within the visualized right apex, incompletely evaluated on this examination and indeterminate. Other: None IMPRESSION: No acute intracranial injury.  No calvarial fracture. No acute fracture or listhesis of the cervical spine. Spiculated density within the visualized right upper lobe, incompletely evaluated. This would be better assessed with dedicated imaging if indicated. Electronically Signed   By: Helyn Numbers M.D.   On: 11/26/2020 23:31   DG Foot Complete Left  Result Date:  11/26/2020 CLINICAL DATA:  Ingrown infected big toe.  Redness and swelling. EXAM: LEFT FOOT - COMPLETE 3+ VIEW COMPARISON:  None. FINDINGS: Vascular calcifications are identified. There is rare furcation of mild irregularity of the cortex at the base of the distal first phalanx. No other abnormalities. IMPRESSION: Osteomyelitis not excluded in the proximal aspect of the distal first phalanx. Recommend MRI for further assessment. Electronically Signed   By: Gerome Sam III M.D.   On: 11/26/2020 22:45    Procedures Procedures   Medications Ordered in ED Medications  piperacillin-tazobactam (ZOSYN) IVPB 3.375 g (has no administration in time range)    ED Course  I have reviewed the triage vital signs and the nursing notes.  Pertinent labs & imaging results that were available during my care of the patient were reviewed by me  and considered in my medical decision making (see chart for details).  Clinical Course as of 11/26/20 2344  Fri Nov 26, 2020  2334 CT scan of the head without acute finding.  Incidental finding in the chest noted [JK]  2335 CT of the C-spine without acute finding [JK]  2335 Osteomyelitis of the toe not excluded by x-ray. [JK]  2335 CBC unremarkable.  Metabolic panel unremarkable [JK]  2344 Patient appears to be getting more confused.  He is attempting to get out of bed.  Restraints had to be placed to help prevent him falling out of bed.  I will also order dose of Ativan [JK]    Clinical Course User Index [JK] Linwood Dibbles, MD   MDM Rules/Calculators/A&P                           Patient presented to the ED for evaluation of a fall.  Patient also complained of some discomfort in his left great toe.  No evidence of serious injury of his head or C-spine related to his fall.  No evidence of anemia or severe dehydration.  Patient did evidence of inflammation in his toe.  Also appears of somewhat of an brown toenail.  X-ray was performed and suggest the possibility of  osteomyelitis.  MRI recommended.  Is not available at this time.  With his recent fall age and comorbidities I will start him on IV biotics.  I will consult the medical service for admission and anticipate MRI in the morning Final Clinical Impression(s) / ED Diagnoses Final diagnoses:  Cellulitis of toe of left foot  Fall, initial encounter  Skin tear of left elbow without complication, initial encounter     Linwood Dibbles, MD 11/26/20 2338

## 2020-11-26 NOTE — ED Notes (Signed)
Skin tear x2 to left posterior elbow. Cleaned with NS and dressed with Telfa and gauze wrap.

## 2020-11-26 NOTE — ED Triage Notes (Signed)
Pt. BIB [facility] post witnessed fall. Skin tear to the L forearm and knee. He also hit his head, but no loss of consciousness. Pt. Not on blood thinners. Wife also has concerns about L big toe being ingrown and infected.

## 2020-11-27 ENCOUNTER — Encounter (HOSPITAL_COMMUNITY): Payer: Self-pay | Admitting: Internal Medicine

## 2020-11-27 ENCOUNTER — Observation Stay (HOSPITAL_COMMUNITY): Payer: Medicare HMO

## 2020-11-27 ENCOUNTER — Observation Stay (HOSPITAL_BASED_OUTPATIENT_CLINIC_OR_DEPARTMENT_OTHER): Payer: Medicare HMO

## 2020-11-27 DIAGNOSIS — L089 Local infection of the skin and subcutaneous tissue, unspecified: Secondary | ICD-10-CM | POA: Diagnosis not present

## 2020-11-27 DIAGNOSIS — L0390 Cellulitis, unspecified: Secondary | ICD-10-CM

## 2020-11-27 DIAGNOSIS — R9389 Abnormal findings on diagnostic imaging of other specified body structures: Secondary | ICD-10-CM

## 2020-11-27 DIAGNOSIS — W19XXXA Unspecified fall, initial encounter: Secondary | ICD-10-CM

## 2020-11-27 DIAGNOSIS — Y92009 Unspecified place in unspecified non-institutional (private) residence as the place of occurrence of the external cause: Secondary | ICD-10-CM

## 2020-11-27 LAB — BASIC METABOLIC PANEL
Anion gap: 7 (ref 5–15)
BUN: 33 mg/dL — ABNORMAL HIGH (ref 8–23)
CO2: 27 mmol/L (ref 22–32)
Calcium: 9.1 mg/dL (ref 8.9–10.3)
Chloride: 104 mmol/L (ref 98–111)
Creatinine, Ser: 1.26 mg/dL — ABNORMAL HIGH (ref 0.61–1.24)
GFR, Estimated: 53 mL/min — ABNORMAL LOW (ref >60)
Glucose, Bld: 88 mg/dL (ref 70–99)
Potassium: 3.6 mmol/L (ref 3.5–5.1)
Sodium: 138 mmol/L (ref 135–145)

## 2020-11-27 LAB — CBC
HCT: 35.7 % — ABNORMAL LOW (ref 39.0–52.0)
Hemoglobin: 11.6 g/dL — ABNORMAL LOW (ref 13.0–17.0)
MCH: 30.2 pg (ref 26.0–34.0)
MCHC: 32.5 g/dL (ref 30.0–36.0)
MCV: 93 fL (ref 80.0–100.0)
Platelets: 253 10*3/uL (ref 150–400)
RBC: 3.84 MIL/uL — ABNORMAL LOW (ref 4.22–5.81)
RDW: 13.4 % (ref 11.5–15.5)
WBC: 5.9 10*3/uL (ref 4.0–10.5)
nRBC: 0 % (ref 0.0–0.2)

## 2020-11-27 LAB — RESP PANEL BY RT-PCR (FLU A&B, COVID) ARPGX2
Influenza A by PCR: NEGATIVE
Influenza B by PCR: NEGATIVE
SARS Coronavirus 2 by RT PCR: NEGATIVE

## 2020-11-27 LAB — MRSA NEXT GEN BY PCR, NASAL: MRSA by PCR Next Gen: NOT DETECTED

## 2020-11-27 LAB — HEMOGLOBIN A1C
Hgb A1c MFr Bld: 5.8 % — ABNORMAL HIGH (ref 4.8–5.6)
Mean Plasma Glucose: 119.76 mg/dL

## 2020-11-27 LAB — SEDIMENTATION RATE: Sed Rate: 57 mm/hr — ABNORMAL HIGH (ref 0–16)

## 2020-11-27 LAB — C-REACTIVE PROTEIN: CRP: 5.6 mg/dL — ABNORMAL HIGH (ref <1.0)

## 2020-11-27 MED ORDER — METRONIDAZOLE 500 MG/100ML IV SOLN
500.0000 mg | Freq: Two times a day (BID) | INTRAVENOUS | Status: DC
  Administered 2020-11-27 (×2): 500 mg via INTRAVENOUS
  Filled 2020-11-27 (×3): qty 100

## 2020-11-27 MED ORDER — HYDRALAZINE HCL 20 MG/ML IJ SOLN
5.0000 mg | INTRAMUSCULAR | Status: DC | PRN
Start: 2020-11-27 — End: 2020-11-27

## 2020-11-27 MED ORDER — HALOPERIDOL LACTATE 5 MG/ML IJ SOLN: 2.0000 mg | Freq: Four times a day (QID) | INTRAMUSCULAR | Status: DC | PRN

## 2020-11-27 MED ORDER — LORAZEPAM 2 MG/ML IJ SOLN
0.5000 mg | INTRAMUSCULAR | Status: DC | PRN
  Administered 2020-11-27 (×2): 0.5 mg via INTRAVENOUS
  Filled 2020-11-27 (×2): qty 1

## 2020-11-27 MED ORDER — ENSURE ENLIVE PO LIQD
237.0000 mL | Freq: Two times a day (BID) | ORAL | Status: DC
  Administered 2020-11-28: 237 mL via ORAL

## 2020-11-27 NOTE — Progress Notes (Signed)
Wife is primary contact Ethel [phone].

## 2020-11-27 NOTE — Assessment & Plan Note (Signed)
Delirium precautions 

## 2020-11-27 NOTE — Progress Notes (Signed)
PROGRESS NOTE    Christopher Gross  [id number] DOB: 12/07/1924 DOA: 11/26/2020 PCP: Janith Lima, MD   Chief Complaint  Patient presents with   Fall    Brief Narrative:   DAUGHTRY DOBEK is Christopher Gross 87 y.o. male with medical history significant of BPH, dementia.   Pt presents to ED for mechanical fall.  Had fall at home this evening, hit head.  Skin tear to L elbow.  Unknown if LOC.  No CP, no SOB. No fevers/chills.   Has been having L big toe pain.   Family notes that he has ingrown toe nail and they are concerned about infection.     ED Course: No obvious major injury from fall on work up.   Does have ? Of osteomyelitis of L great distal phalynx on X ray though.  MRI recd.  Pt started on zosyn in ED.   During ED stay, became more confused.  Got restraints and ativan to prevent falling out of bed.   Looks like anxiety / confusion / sundowning is chronic for patient as described in his PCPs note from April.  Assessment & Plan:   Principal Problem:   Infection of great toe Active Problems:   Abnormal CT scan   Dementia (HCC)   Fall at home, initial encounter   Acute encephalopathy  * Infection of great toe MRI nondiagnostic Marrow edema in region of great toe IP joint CRP pending Sed rate 57 Discussed with podiatry who will see him Will continue abx ABI's with noncompressible RLE arteries and mild left lower extremity arterial disease - limited exam, see report   Abnormal CT scan Spiculated density of visualized right upper lobe, will discuss with family   Fall at home, initial encounter CT head/neck without acute finding Consider PT/OT if he's more cooperative  Dementia ([place]) Delirium precautions  Acute encephalopathy Likely 2/2 acute hospitalization, dementia He's in restraints Prn ativan low dose Delirium precautions   DVT prophylaxis: lovenox Code Status: full  Family Communication: none at bedside Disposition:   Status is: Observation  The  patient will require care spanning > 2 midnights and should be moved to inpatient because: need for IV abx and podiatry eval   Consultants:  podiatry  Procedures:  ABI Summary:  Right: Resting right ankle-brachial index indicates noncompressible right  lower extremity arteries.   Values are likely innacurate due to the above listed limitations. Unable  to obtain posterior tibial artery values due to patient positioning and  movement. Unable to obtain TBI due to constant patient movement.  Left: Resting left ankle-brachial index indicates mild left lower  extremity arterial disease.   Values are likely innacurate due to the above listed limitations. Unable  to obtain posterior tibial artery values due to patient positioning and  movement. Unable to obtain TBI due to constant patient movement.     *See table(s) above for measurements and observations.   Antimicrobials:  Anti-infectives (From admission, onward)    Start     Dose/Rate Route Frequency Ordered Stop   11/27/20 1000  cefTRIAXone (ROCEPHIN) 2 g in sodium chloride 0.9 % 100 mL IVPB       See Hyperspace for full Linked Orders Report.   2 g 200 mL/hr over 30 Minutes Intravenous Every 24 hours 11/26/20 2349 12/04/20 0959   11/27/20 1000  metroNIDAZOLE (FLAGYL) IVPB 500 mg        500 mg 100 mL/hr over 60 Minutes Intravenous Every 12 hours 11/27/20 0048  11/27/20 0000  metroNIDAZOLE (FLAGYL) tablet 500 mg  Status:  Discontinued       See Hyperspace for full Linked Orders Report.   500 mg Oral Every 12 hours 11/26/20 2349 11/27/20 0048   11/26/20 2345  piperacillin-tazobactam (ZOSYN) IVPB 3.375 g        3.375 g 100 mL/hr over 30 Minutes Intravenous  Once 11/26/20 2335 11/27/20 0013          Subjective: Delirious, confused  Objective: Vitals:   11/27/20 0812 11/27/20 1002 11/27/20 1200 11/27/20 1400  BP: (!) 173/95 (!) 183/95 (!) 168/85 (!) 168/89  Pulse: 82 80 79 85  Resp: 16 18 18 16   Temp:      TempSrc:       SpO2: 97% 97% 100% 100%  Weight:      Height:       No intake or output data in the 24 hours ending 11/27/20 1432 Filed Weights   11/26/20 2153  Weight: 49.9 kg    Examination:  General exam: delirious Respiratory system: unlabored Cardiovascular system: RRR Gastrointestinal system: Abdomen is nondistended, soft and nontender. Central nervous system: moving all extremities, delirious, confused Extremities: no LEE, L first toe erythematous, swollen, ingrown toenail    Data Reviewed: I have personally reviewed following labs and imaging studies  CBC: Recent Labs  Lab 11/26/20 2249 11/27/20 0423  WBC 5.4 5.9  HGB 11.5* 11.6*  HCT 35.0* 35.7*  MCV 91.6 93.0  PLT 226 253    Basic Metabolic Panel: Recent Labs  Lab 11/26/20 2249 11/27/20 0423  NA 136 138  K 3.9 3.6  CL 102 104  CO2 27 27  GLUCOSE 99 88  BUN 36* 33*  CREATININE 1.18 1.26*  CALCIUM 9.2 9.1    GFR: Estimated Creatinine Clearance: 25.3 mL/min (Reiley Keisler) (by C-G formula based on SCr of 1.26 mg/dL (H)).  Liver Function Tests: No results for input(s): AST, ALT, ALKPHOS, BILITOT, PROT, ALBUMIN in the last 168 hours.  CBG: No results for input(s): GLUCAP in the last 168 hours.   Recent Results (from the past 240 hour(s))  MRSA Next Gen by PCR, Nasal     Status: None   Collection Time: 11/26/20 11:51 PM   Specimen: Nasal Mucosa; Nasal Swab  Result Value Ref Range Status   MRSA by PCR Next Gen NOT DETECTED NOT DETECTED Final    Comment: (NOTE) The GeneXpert MRSA Assay (FDA approved for NASAL specimens only), is one component of Mikaylee Arseneau comprehensive MRSA colonization surveillance program. It is not intended to diagnose MRSA infection nor to guide or monitor treatment for MRSA infections. Test performance is not FDA approved in patients less than 70 years old. Performed at [facility], [address], [place], [place]   Resp Panel by RT-PCR (Flu Jarret Torre&B, Covid) Nasopharyngeal  Swab     Status: None   Collection Time: 11/27/20  1:31 AM   Specimen: Nasopharyngeal Swab; Nasopharyngeal(NP) swabs in vial transport medium  Result Value Ref Range Status   SARS Coronavirus 2 by RT PCR NEGATIVE NEGATIVE Final    Comment: (NOTE) SARS-CoV-2 target nucleic acids are NOT DETECTED.  The SARS-CoV-2 RNA is generally detectable in upper respiratory specimens during the acute phase of infection. The lowest concentration of SARS-CoV-2 viral copies this assay can detect is 138 copies/mL. Treasure Ingrum negative result does not preclude SARS-Cov-2 infection and should not be used as the sole basis for treatment or other patient management decisions. Archimedes Harold negative result may occur with  improper specimen collection/handling, submission of specimen other than nasopharyngeal swab, presence of viral mutation(s) within the areas targeted by this assay, and inadequate number of viral copies(<138 copies/mL). Chisum Habenicht negative result must be combined with clinical observations, patient history, and epidemiological information. The expected result is Negative.  Fact Sheet for Patients:  EntrepreneurPulse.com.au  Fact Sheet for Healthcare Providers:  IncredibleEmployment.be  This test is no t yet approved or cleared by the [place] FDA and  has been authorized for detection and/or diagnosis of SARS-CoV-2 by FDA under an Emergency Use Authorization (EUA). This EUA will remain  in effect (meaning this test can be used) for the duration of the COVID-19 declaration under Section 564(b)(1) of the Act, 21 U.S.C.section 360bbb-3(b)(1), unless the authorization is terminated  or revoked sooner.       Influenza Kiano Terrien by PCR NEGATIVE NEGATIVE Final   Influenza B by PCR NEGATIVE NEGATIVE Final    Comment: (NOTE) The Xpert Xpress SARS-CoV-2/FLU/RSV plus assay is intended as an aid in the diagnosis of influenza from Nasopharyngeal swab specimens and should not be used as Shaquaya Wuellner sole  basis for treatment. Nasal washings and aspirates are unacceptable for Xpert Xpress SARS-CoV-2/FLU/RSV testing.  Fact Sheet for Patients: EntrepreneurPulse.com.au  Fact Sheet for Healthcare Providers: IncredibleEmployment.be  This test is not yet approved or cleared by the [place] FDA and has been authorized for detection and/or diagnosis of SARS-CoV-2 by FDA under an Emergency Use Authorization (EUA). This EUA will remain in effect (meaning this test can be used) for the duration of the COVID-19 declaration under Section 564(b)(1) of the Act, 21 U.S.C. section 360bbb-3(b)(1), unless the authorization is terminated or revoked.  Performed at [facility], [place] [address] [place], [place] [address]          Radiology Studies: CT Head Wo Contrast  Result Date: 11/26/2020 CLINICAL DATA:  Witnessed fall, head and neck trauma EXAM: CT HEAD WITHOUT CONTRAST CT CERVICAL SPINE WITHOUT CONTRAST TECHNIQUE: Multidetector CT imaging of the head and cervical spine was performed following the standard protocol without intravenous contrast. Multiplanar CT image reconstructions of the cervical spine were also generated. COMPARISON:  03/28/2019 FINDINGS: CT HEAD FINDINGS Brain: Normal anatomic configuration. Parenchymal volume loss is commensurate with the patient's age. Mild periventricular white matter changes are present likely reflecting the sequela of small vessel ischemia. No abnormal intra or extra-axial mass lesion or fluid collection. No abnormal mass effect or midline shift. No evidence of acute intracranial hemorrhage or infarct. Mild ventriculomegaly is stable. Cerebellum unremarkable. Vascular: No asymmetric hyperdense vasculature at the skull base. Skull: Intact Sinuses/Orbits: Paranasal sinuses are clear. Ocular lenses have been removed. Orbits are otherwise unremarkable. Other: Mastoid air cells and middle ear cavities are  clear. CT CERVICAL SPINE FINDINGS Alignment: Normal cervical lordosis. Skull base and vertebrae: Craniocervical alignment is normal. The atlantodental interval is not widened. Degenerative changes are noted at the atlantodental articulation. No acute fracture of the cervical spine. Vertebral body height is preserved. Soft tissues and spinal canal: No prevertebral fluid or swelling. No visible canal hematoma. Disc levels: There is diffuse intervertebral disc space narrowing and endplate remodeling throughout the cervical spine in keeping with severe degenerative disc disease. The prevertebral soft tissues are not thickened. Review of the axial images demonstrates no significant spinal canal stenosis. Multilevel facet arthrosis results in multilevel mild neuroforaminal narrowing, most severe on the right at C3-4. Upper chest: Jesyka Slaght sub solid spiculated density is seen within the visualized right apex, incompletely evaluated on this  examination and indeterminate. Other: None IMPRESSION: No acute intracranial injury.  No calvarial fracture. No acute fracture or listhesis of the cervical spine. Spiculated density within the visualized right upper lobe, incompletely evaluated. This would be better assessed with dedicated imaging if indicated. Electronically Signed   By: Fidela Salisbury M.D.   On: 11/26/2020 23:31   CT Cervical Spine Wo Contrast  Result Date: 11/26/2020 CLINICAL DATA:  Witnessed fall, head and neck trauma EXAM: CT HEAD WITHOUT CONTRAST CT CERVICAL SPINE WITHOUT CONTRAST TECHNIQUE: Multidetector CT imaging of the head and cervical spine was performed following the standard protocol without intravenous contrast. Multiplanar CT image reconstructions of the cervical spine were also generated. COMPARISON:  03/28/2019 FINDINGS: CT HEAD FINDINGS Brain: Normal anatomic configuration. Parenchymal volume loss is commensurate with the patient's age. Mild periventricular white matter changes are present likely  reflecting the sequela of small vessel ischemia. No abnormal intra or extra-axial mass lesion or fluid collection. No abnormal mass effect or midline shift. No evidence of acute intracranial hemorrhage or infarct. Mild ventriculomegaly is stable. Cerebellum unremarkable. Vascular: No asymmetric hyperdense vasculature at the skull base. Skull: Intact Sinuses/Orbits: Paranasal sinuses are clear. Ocular lenses have been removed. Orbits are otherwise unremarkable. Other: Mastoid air cells and middle ear cavities are clear. CT CERVICAL SPINE FINDINGS Alignment: Normal cervical lordosis. Skull base and vertebrae: Craniocervical alignment is normal. The atlantodental interval is not widened. Degenerative changes are noted at the atlantodental articulation. No acute fracture of the cervical spine. Vertebral body height is preserved. Soft tissues and spinal canal: No prevertebral fluid or swelling. No visible canal hematoma. Disc levels: There is diffuse intervertebral disc space narrowing and endplate remodeling throughout the cervical spine in keeping with severe degenerative disc disease. The prevertebral soft tissues are not thickened. Review of the axial images demonstrates no significant spinal canal stenosis. Multilevel facet arthrosis results in multilevel mild neuroforaminal narrowing, most severe on the right at C3-4. Upper chest: Carmon Sahli sub solid spiculated density is seen within the visualized right apex, incompletely evaluated on this examination and indeterminate. Other: None IMPRESSION: No acute intracranial injury.  No calvarial fracture. No acute fracture or listhesis of the cervical spine. Spiculated density within the visualized right upper lobe, incompletely evaluated. This would be better assessed with dedicated imaging if indicated. Electronically Signed   By: Fidela Salisbury M.D.   On: 11/26/2020 23:31   MR TOES LEFT WO CONTRAST  Result Date: 11/27/2020 CLINICAL DATA:  Osteomyelitis EXAM: MRI OF THE  LEFT TOES WITHOUT CONTRAST TECHNIQUE: Multiplanar, multisequence MR imaging of the left toes was performed. No intravenous contrast was administered. COMPARISON:  X-ray 11/26/2020 FINDINGS: Nondiagnostic examination as patient was unable to tolerate scanning. Multiple attempts were made to to assist patient with the exam and were unsuccessful. Two partial markedly motion degraded sequences were submitted which appears to demonstrate marrow edema in the region of the great toe interphalangeal joint, although evaluation for osteomyelitis is nondiagnostic (series 4, image 11). IMPRESSION: 1. Nondiagnostic examination.  See above. 2. Apparent marrow edema in the region of the great toe interphalangeal joint, although evaluation for osteomyelitis is nondiagnostic. Electronically Signed   By: Davina Poke D.O.   On: 11/27/2020 12:09   DG Foot Complete Left  Result Date: 11/26/2020 CLINICAL DATA:  Ingrown infected big toe.  Redness and swelling. EXAM: LEFT FOOT - COMPLETE 3+ VIEW COMPARISON:  None. FINDINGS: Vascular calcifications are identified. There is rare furcation of mild irregularity of the cortex at the base of the distal  first phalanx. No other abnormalities. IMPRESSION: Osteomyelitis not excluded in the proximal aspect of the distal first phalanx. Recommend MRI for further assessment. Electronically Signed   By: Dorise Bullion III M.D.   On: 11/26/2020 22:45   VAS [place] ABI WITH/WO TBI  Result Date: 11/27/2020  LOWER EXTREMITY DOPPLER STUDY Patient Name:  LYNTON YATSKO  Date of Exam:   11/27/2020 Medical Rec #: [id number]      Accession #:    [id number] Date of Birth: 1924-01-15      Patient Gender: M Patient Age:   72 years Exam Location:  [facility] Procedure:      VAS [place] ABI WITH/WO TBI Referring Phys: Jennette Kettle --------------------------------------------------------------------------------  Indications: Ulceration. High Risk Factors: None.  Limitations: Today's exam was limited due  to patient positioning, patient unable              to lie flat and constant patient movement, poor patient              cooperation, patient confusion. Comparison Study: No prior studies. Performing Technologist: Carlos Levering RVT  Examination Guidelines: Efe Fazzino complete evaluation includes at minimum, Doppler waveform signals and systolic blood pressure reading at the level of bilateral brachial, anterior tibial, and posterior tibial arteries, when vessel segments are accessible. Bilateral testing is considered an integral part of Ossiel Marchio complete examination. Photoelectric Plethysmograph (PPG) waveforms and toe systolic pressure readings are included as required and additional duplex testing as needed. Limited examinations for reoccurring indications may be performed as noted.  ABI Findings: +---------+------------------+-----+---------+----------------+ Right    Rt Pressure (mmHg)IndexWaveform Comment          +---------+------------------+-----+---------+----------------+ Brachial 183                    triphasic                 +---------+------------------+-----+---------+----------------+ PTA                                      Unable to assess +---------+------------------+-----+---------+----------------+ DP       254               1.39 biphasic                  +---------+------------------+-----+---------+----------------+ Great Toe                                Unable to assess +---------+------------------+-----+---------+----------------+ +---------+------------------+-----+--------+----------------+ Left     Lt Pressure (mmHg)IndexWaveformComment          +---------+------------------+-----+--------+----------------+ PTA                                     Unable to assess +---------+------------------+-----+--------+----------------+ DP       150               0.82 biphasic                 +---------+------------------+-----+--------+----------------+ Great  Toe                               Unable to assess +---------+------------------+-----+--------+----------------+ +-------+-----------+-----------+------------+------------+ ABI/TBIToday's ABIToday's TBIPrevious ABIPrevious TBI +-------+-----------+-----------+------------+------------+ Right  [place]                                             +-------+-----------+-----------+------------+------------+  Left   0.82                                           +-------+-----------+-----------+------------+------------+   Summary: Right: Resting right ankle-brachial index indicates noncompressible right lower extremity arteries. Values are likely innacurate due to the above listed limitations. Unable to obtain posterior tibial artery values due to patient positioning and movement. Unable to obtain TBI due to constant patient movement. Left: Resting left ankle-brachial index indicates mild left lower extremity arterial disease. Values are likely innacurate due to the above listed limitations. Unable to obtain posterior tibial artery values due to patient positioning and movement. Unable to obtain TBI due to constant patient movement.  *See table(s) above for measurements and observations.  Electronically signed by Jamelle Haring on 11/27/2020 at 11:07:00 AM.    Final         Scheduled Meds:  enoxaparin (LOVENOX) injection  30 mg Subcutaneous Q24H   Continuous Infusions:  cefTRIAXone (ROCEPHIN)  IV Stopped (11/27/20 1017)   metronidazole Stopped (11/27/20 1255)     LOS: 0 days    Time spent: over 30 min    Fayrene Helper, MD [facility]   To contact the attending provider between 7A-7P or the covering provider during after hours 7P-7A, please log into the web site [email or web address] and access using universal [place] password for that web site. If you do not have the password, please call the hospital operator.  11/27/2020, 2:32 PM

## 2020-11-27 NOTE — Assessment & Plan Note (Addendum)
Likely 2/2 acute hospitalization, dementia, infection Prn ativan low dose Delirium precautions

## 2020-11-27 NOTE — ED Notes (Signed)
Pt brought back to room by MRI.  MRI reports pt was unable to complete MRI d/t not being able to hold still despite medications.

## 2020-11-27 NOTE — Progress Notes (Signed)
Obtained history and information from wife who is patient's primary caregiver. Wife states she helps him with everything. His dementia is getting worse, he is not currently on any dementia medicine. Wife states that at baseline he is confused.

## 2020-11-27 NOTE — Assessment & Plan Note (Addendum)
Spiculated density of visualized right upper lobe, will discuss with family --- at this time, can hold off on further workup

## 2020-11-27 NOTE — Assessment & Plan Note (Addendum)
MRI nondiagnostic Marrow edema in region of great toe IP joint CRP 5.6 Sed rate 57 Discussed with podiatry who will see him -> recommending keflex, mupirocin ointment, dressing changes around nail Will continue abx (will leave on ceftriaxone for now with pneumonia, add flagyl) ABI's with noncompressible RLE arteries and mild left lower extremity arterial disease - limited exam, see report

## 2020-11-27 NOTE — Assessment & Plan Note (Signed)
CT head/neck without acute finding Consider PT/OT if he's more cooperative

## 2020-11-27 NOTE — Progress Notes (Signed)
ABI's have been completed. Preliminary results can be found in CV Proc through chart review.   11/27/20 9:35 AM Olen Cordial RVT

## 2020-11-27 NOTE — ED Notes (Signed)
Patient transported to MRI 

## 2020-11-28 ENCOUNTER — Observation Stay (HOSPITAL_COMMUNITY): Payer: Medicare HMO

## 2020-11-28 DIAGNOSIS — J189 Pneumonia, unspecified organism: Secondary | ICD-10-CM

## 2020-11-28 DIAGNOSIS — L089 Local infection of the skin and subcutaneous tissue, unspecified: Secondary | ICD-10-CM | POA: Diagnosis not present

## 2020-11-28 DIAGNOSIS — R1310 Dysphagia, unspecified: Secondary | ICD-10-CM

## 2020-11-28 LAB — CBC WITH DIFFERENTIAL/PLATELET
Abs Immature Granulocytes: 0.03 10*3/uL (ref 0.00–0.07)
Basophils Absolute: 0 10*3/uL (ref 0.0–0.1)
Basophils Relative: 0 %
Eosinophils Absolute: 0 10*3/uL (ref 0.0–0.5)
Eosinophils Relative: 0 %
HCT: 32.5 % — ABNORMAL LOW (ref 39.0–52.0)
Hemoglobin: 10.7 g/dL — ABNORMAL LOW (ref 13.0–17.0)
Immature Granulocytes: 0 %
Lymphocytes Relative: 7 %
Lymphs Abs: 0.6 10*3/uL — ABNORMAL LOW (ref 0.7–4.0)
MCH: 30 pg (ref 26.0–34.0)
MCHC: 32.9 g/dL (ref 30.0–36.0)
MCV: 91 fL (ref 80.0–100.0)
Monocytes Absolute: 0.6 10*3/uL (ref 0.1–1.0)
Monocytes Relative: 6 %
Neutro Abs: 7.9 10*3/uL — ABNORMAL HIGH (ref 1.7–7.7)
Neutrophils Relative %: 87 %
Platelets: 240 10*3/uL (ref 150–400)
RBC: 3.57 MIL/uL — ABNORMAL LOW (ref 4.22–5.81)
RDW: 13.4 % (ref 11.5–15.5)
WBC: 9.2 10*3/uL (ref 4.0–10.5)
nRBC: 0 % (ref 0.0–0.2)

## 2020-11-28 LAB — COMPREHENSIVE METABOLIC PANEL
ALT: 18 U/L (ref 0–44)
AST: 26 U/L (ref 15–41)
Albumin: 3 g/dL — ABNORMAL LOW (ref 3.5–5.0)
Alkaline Phosphatase: 89 U/L (ref 38–126)
Anion gap: 12 (ref 5–15)
BUN: 30 mg/dL — ABNORMAL HIGH (ref 8–23)
CO2: 22 mmol/L (ref 22–32)
Calcium: 8.8 mg/dL — ABNORMAL LOW (ref 8.9–10.3)
Chloride: 106 mmol/L (ref 98–111)
Creatinine, Ser: 1.25 mg/dL — ABNORMAL HIGH (ref 0.61–1.24)
GFR, Estimated: 53 mL/min — ABNORMAL LOW (ref >60)
Glucose, Bld: 71 mg/dL (ref 70–99)
Potassium: 3.5 mmol/L (ref 3.5–5.1)
Sodium: 140 mmol/L (ref 135–145)
Total Bilirubin: 1 mg/dL (ref 0.3–1.2)
Total Protein: 6.5 g/dL (ref 6.5–8.1)

## 2020-11-28 LAB — MAGNESIUM: Magnesium: 1.9 mg/dL (ref 1.7–2.4)

## 2020-11-28 LAB — PHOSPHORUS: Phosphorus: 3.8 mg/dL (ref 2.5–4.6)

## 2020-11-28 MED ORDER — MUPIROCIN 2 % EX OINT
TOPICAL_OINTMENT | Freq: Two times a day (BID) | CUTANEOUS | Status: DC
  Administered 2020-12-04 – 2020-12-05 (×2): 1 via TOPICAL
  Filled 2020-11-28 (×2): qty 22

## 2020-11-28 MED ORDER — LIP MEDEX EX OINT
TOPICAL_OINTMENT | CUTANEOUS | Status: AC
  Administered 2020-11-28: 1
  Filled 2020-11-28: qty 7

## 2020-11-28 MED ORDER — ADULT MULTIVITAMIN W/MINERALS CH
1.0000 | ORAL_TABLET | Freq: Every day | ORAL | Status: DC
Start: 2020-11-28 — End: 2020-12-10
  Administered 2020-12-01 – 2020-12-09 (×6): 1 via ORAL
  Filled 2020-11-28 (×9): qty 1

## 2020-11-28 MED ORDER — ENSURE ENLIVE PO LIQD
237.0000 mL | Freq: Three times a day (TID) | ORAL | Status: DC
  Administered 2020-12-01 – 2020-12-07 (×9): 237 mL via ORAL

## 2020-11-28 MED ORDER — CEFAZOLIN SODIUM-DEXTROSE 1-4 GM/50ML-% IV SOLN
1.0000 g | Freq: Two times a day (BID) | INTRAVENOUS | Status: DC
  Administered 2020-11-28: 1 g via INTRAVENOUS
  Filled 2020-11-28 (×2): qty 50

## 2020-11-28 MED ORDER — SODIUM CHLORIDE 0.9 % IV SOLN
1.0000 g | INTRAVENOUS | Status: DC
  Administered 2020-11-28 – 2020-12-05 (×8): 1 g via INTRAVENOUS
  Filled 2020-11-28 (×8): qty 10

## 2020-11-28 NOTE — Consult Note (Signed)
Reason for Consult: Infection left hallux Referring Physician: Dr. Lacretia Nicks, MD  Christopher Gross is an 87 y.o. male.  HPI: 87 year old male with medical history significant of BPH, dementia presenting the emergency department due to mechanical fall, hit head and skin tear to left elbow.  Of note was found having left big toe pain.  Family was concerned of ingrown toenail and infection.  X-rays were not able to fully exclude osteomyelitis.  MRI was performed but was suboptimal osteomyelitis was nondiagnostic.  Arterial studies performed but likely inaccurate.  This showed mild disease on the left side.  Past Medical History:  Diagnosis Date   BPH (benign prostatic hypertrophy)    Dementia (HCC)     History reviewed. No pertinent surgical history.  History reviewed. No pertinent family history.  Social History:  reports that he has never smoked. He has never used smokeless tobacco. He reports that he does not currently use drugs. He reports that he does not drink alcohol.  Allergies:  Allergies  Allergen Reactions   Aricept [Donepezil] Other (See Comments)    Medications: I have reviewed the patient's current medications.  Results for orders placed or performed during the hospital encounter of 11/26/20 (from the past 48 hour(s))  CBC     Status: Abnormal   Collection Time: 11/26/20 10:49 PM  Result Value Ref Range   WBC 5.4 4.0 - 10.5 K/uL   RBC 3.82 (L) 4.22 - 5.81 MIL/uL   Hemoglobin 11.5 (L) 13.0 - 17.0 g/dL   HCT 26.2 (L) 03.5 - 59.7 %   MCV 91.6 80.0 - 100.0 fL   MCH 30.1 26.0 - 34.0 pg   MCHC 32.9 30.0 - 36.0 g/dL   RDW 41.6 38.4 - 53.6 %   Platelets 226 150 - 400 K/uL   nRBC 0.0 0.0 - 0.2 %    Comment: Performed at [facility], [address], [place], [place] [address]  Basic metabolic panel     Status: Abnormal   Collection Time: 11/26/20 10:49 PM  Result Value Ref Range   Sodium 136 135 - 145 mmol/L   Potassium 3.9 3.5 - 5.1 mmol/L    Chloride 102 98 - 111 mmol/L   CO2 27 22 - 32 mmol/L   Glucose, Bld 99 70 - 99 mg/dL    Comment: Glucose reference range applies only to samples taken after fasting for at least 8 hours.   BUN 36 (H) 8 - 23 mg/dL   Creatinine, Ser 2.12 0.61 - 1.24 mg/dL   Calcium 9.2 8.9 - 24.8 mg/dL   GFR, Estimated 57 (L) >60 mL/min    Comment: (NOTE) Calculated using the CKD-EPI Creatinine Equation (2021)    Anion gap 7 5 - 15    Comment: Performed at [facility], [address], [place], [place] [address]  Hemoglobin A1c     Status: Abnormal   Collection Time: 11/26/20 10:49 PM  Result Value Ref Range   Hgb A1c MFr Bld 5.8 (H) 4.8 - 5.6 %    Comment: (NOTE) Pre diabetes:          5.7%-6.4%  Diabetes:              >6.4%  Glycemic control for   <7.0% adults with diabetes    Mean Plasma Glucose 119.76 mg/dL    Comment: Performed at [facility], [address] [place], [place] [address]  MRSA Next Gen by PCR, Nasal  Status: None   Collection Time: 11/26/20 11:51 PM   Specimen: Nasal Mucosa; Nasal Swab  Result Value Ref Range   MRSA by PCR Next Gen NOT DETECTED NOT DETECTED    Comment: (NOTE) The GeneXpert MRSA Assay (FDA approved for NASAL specimens only), is one component of a comprehensive MRSA colonization surveillance program. It is not intended to diagnose MRSA infection nor to guide or monitor treatment for MRSA infections. Test performance is not FDA approved in patients less than 80 years old. Performed at [facility], [place] [address] [place] [address] [place], [place] [address]   Resp Panel by RT-PCR (Flu A&B, Covid) Nasopharyngeal Swab     Status: None   Collection Time: 11/27/20  1:31 AM   Specimen: Nasopharyngeal Swab; Nasopharyngeal(NP) swabs in vial transport medium  Result Value Ref Range   SARS Coronavirus 2 by RT PCR NEGATIVE NEGATIVE    Comment: (NOTE) SARS-CoV-2 target nucleic acids are NOT DETECTED.  The SARS-CoV-2 RNA is generally  detectable in upper respiratory specimens during the acute phase of infection. The lowest concentration of SARS-CoV-2 viral copies this assay can detect is 138 copies/mL. A negative result does not preclude SARS-Cov-2 infection and should not be used as the sole basis for treatment or other patient management decisions. A negative result may occur with  improper specimen collection/handling, submission of specimen other than nasopharyngeal swab, presence of viral mutation(s) within the areas targeted by this assay, and inadequate number of viral copies(<138 copies/mL). A negative result must be combined with clinical observations, patient history, and epidemiological information. The expected result is Negative.  Fact Sheet for Patients:  EntrepreneurPulse.com.au  Fact Sheet for Healthcare Providers:  IncredibleEmployment.be  This test is no t yet approved or cleared by the [place] FDA and  has been authorized for detection and/or diagnosis of SARS-CoV-2 by FDA under an Emergency Use Authorization (EUA). This EUA will remain  in effect (meaning this test can be used) for the duration of the COVID-19 declaration under Section 564(b)(1) of the Act, 21 U.S.C.section 360bbb-3(b)(1), unless the authorization is terminated  or revoked sooner.       Influenza A by PCR NEGATIVE NEGATIVE   Influenza B by PCR NEGATIVE NEGATIVE    Comment: (NOTE) The Xpert Xpress SARS-CoV-2/FLU/RSV plus assay is intended as an aid in the diagnosis of influenza from Nasopharyngeal swab specimens and should not be used as a sole basis for treatment. Nasal washings and aspirates are unacceptable for Xpert Xpress SARS-CoV-2/FLU/RSV testing.  Fact Sheet for Patients: EntrepreneurPulse.com.au  Fact Sheet for Healthcare Providers: IncredibleEmployment.be  This test is not yet approved or cleared by the [place] FDA and has  been authorized for detection and/or diagnosis of SARS-CoV-2 by FDA under an Emergency Use Authorization (EUA). This EUA will remain in effect (meaning this test can be used) for the duration of the COVID-19 declaration under Section 564(b)(1) of the Act, 21 U.S.C. section 360bbb-3(b)(1), unless the authorization is terminated or revoked.  Performed at [facility], [place] [address] [place], [place] [address]   CBC     Status: Abnormal   Collection Time: 11/27/20  4:23 AM  Result Value Ref Range   WBC 5.9 4.0 - 10.5 K/uL   RBC 3.84 (L) 4.22 - 5.81 MIL/uL   Hemoglobin 11.6 (L) 13.0 - 17.0 g/dL   HCT 35.7 (L) 39.0 - 52.0 %   MCV 93.0 80.0 - 100.0 fL   MCH 30.2 26.0 - 34.0 pg   MCHC 32.5  30.0 - 36.0 g/dL   RDW 13.4 11.5 - 15.5 %   Platelets 253 150 - 400 K/uL   nRBC 0.0 0.0 - 0.2 %    Comment: Performed at [facility], [place] [address] [place], [place] [id number]  Basic metabolic panel     Status: Abnormal   Collection Time: 11/27/20  4:23 AM  Result Value Ref Range   Sodium 138 135 - 145 mmol/L   Potassium 3.6 3.5 - 5.1 mmol/L   Chloride 104 98 - 111 mmol/L   CO2 27 22 - 32 mmol/L   Glucose, Bld 88 70 - 99 mg/dL    Comment: Glucose reference range applies only to samples taken after fasting for at least 8 hours.   BUN 33 (H) 8 - 23 mg/dL   Creatinine, Ser 1.26 (H) 0.61 - 1.24 mg/dL   Calcium 9.1 8.9 - 10.3 mg/dL   GFR, Estimated 53 (L) >60 mL/min    Comment: (NOTE) Calculated using the CKD-EPI Creatinine Equation (2021)    Anion gap 7 5 - 15    Comment: Performed at [facility], [place] [address] [place], [place] [address]  C-reactive protein     Status: Abnormal   Collection Time: 11/27/20  4:23 AM  Result Value Ref Range   CRP 5.6 (H) <1.0 mg/dL    Comment: Performed at [place] [address] [place], [place] [address]  Sedimentation rate     Status: Abnormal   Collection Time: 11/27/20  4:23 AM   Result Value Ref Range   Sed Rate 57 (H) 0 - 16 mm/hr    Comment: Performed at [facility], [place] [address] [place], [place] [address]  CBC with Differential/Platelet     Status: Abnormal   Collection Time: 11/28/20  5:55 AM  Result Value Ref Range   WBC 9.2 4.0 - 10.5 K/uL   RBC 3.57 (L) 4.22 - 5.81 MIL/uL   Hemoglobin 10.7 (L) 13.0 - 17.0 g/dL   HCT 32.5 (L) 39.0 - 52.0 %   MCV 91.0 80.0 - 100.0 fL   MCH 30.0 26.0 - 34.0 pg   MCHC 32.9 30.0 - 36.0 g/dL   RDW 13.4 11.5 - 15.5 %   Platelets 240 150 - 400 K/uL   nRBC 0.0 0.0 - 0.2 %   Neutrophils Relative % 87 %   Neutro Abs 7.9 (H) 1.7 - 7.7 K/uL   Lymphocytes Relative 7 %   Lymphs Abs 0.6 (L) 0.7 - 4.0 K/uL   Monocytes Relative 6 %   Monocytes Absolute 0.6 0.1 - 1.0 K/uL   Eosinophils Relative 0 %   Eosinophils Absolute 0.0 0.0 - 0.5 K/uL   Basophils Relative 0 %   Basophils Absolute 0.0 0.0 - 0.1 K/uL   Immature Granulocytes 0 %   Abs Immature Granulocytes 0.03 0.00 - 0.07 K/uL    Comment: Performed at [facility], [place] [address] [place], [place] [address]  Comprehensive metabolic panel     Status: Abnormal   Collection Time: 11/28/20  5:55 AM  Result Value Ref Range   Sodium 140 135 - 145 mmol/L   Potassium 3.5 3.5 - 5.1 mmol/L   Chloride 106 98 - 111 mmol/L   CO2 22 22 - 32 mmol/L   Glucose, Bld 71 70 - 99 mg/dL    Comment: Glucose reference range applies only to samples taken after fasting for at least 8 hours.   BUN  30 (H) 8 - 23 mg/dL   Creatinine, Ser 1.25 (H) 0.61 - 1.24 mg/dL   Calcium 8.8 (L) 8.9 - 10.3 mg/dL   Total Protein 6.5 6.5 - 8.1 g/dL   Albumin 3.0 (L) 3.5 - 5.0 g/dL   AST 26 15 - 41 U/L   ALT 18 0 - 44 U/L   Alkaline Phosphatase 89 38 - 126 U/L   Total Bilirubin 1.0 0.3 - 1.2 mg/dL   GFR, Estimated 53 (L) >60 mL/min    Comment: (NOTE) Calculated using the CKD-EPI Creatinine Equation (2021)    Anion gap 12 5 - 15    Comment: Performed at [facility], [place] [address] [place], [place] [address]  Magnesium     Status: None   Collection Time: 11/28/20  5:55 AM  Result Value Ref Range   Magnesium 1.9 1.7 - 2.4 mg/dL    Comment: Performed at [facility], [place] [address], [place], [place] [address]  Phosphorus     Status: None   Collection Time: 11/28/20  5:55 AM  Result Value Ref Range   Phosphorus 3.8 2.5 - 4.6 mg/dL    Comment: Performed at [facility], [place] [address], [place], [place] [address]    CT Head Wo Contrast  Result Date: 11/26/2020 CLINICAL DATA:  Witnessed fall, head and neck trauma EXAM: CT HEAD WITHOUT CONTRAST CT CERVICAL SPINE WITHOUT CONTRAST TECHNIQUE: Multidetector CT imaging of the head and cervical spine was performed following the standard protocol without intravenous contrast. Multiplanar CT image reconstructions of the cervical spine were also generated. COMPARISON:  03/28/2019 FINDINGS: CT HEAD FINDINGS Brain: Normal anatomic configuration. Parenchymal volume loss is commensurate with the patient's age. Mild periventricular white matter changes are present likely reflecting the sequela of small vessel ischemia. No abnormal intra or extra-axial mass lesion or fluid collection. No abnormal mass effect or midline shift. No evidence of acute intracranial hemorrhage or infarct. Mild ventriculomegaly is stable. Cerebellum unremarkable. Vascular: No asymmetric hyperdense vasculature at the skull base. Skull: Intact Sinuses/Orbits: Paranasal sinuses are clear. Ocular lenses have been removed. Orbits are otherwise unremarkable. Other: Mastoid air cells and middle ear cavities are clear. CT CERVICAL SPINE FINDINGS Alignment: Normal cervical lordosis. Skull base and vertebrae: Craniocervical alignment is normal. The atlantodental interval is not widened. Degenerative changes are noted at the atlantodental articulation. No acute fracture of the cervical spine. Vertebral body  height is preserved. Soft tissues and spinal canal: No prevertebral fluid or swelling. No visible canal hematoma. Disc levels: There is diffuse intervertebral disc space narrowing and endplate remodeling throughout the cervical spine in keeping with severe degenerative disc disease. The prevertebral soft tissues are not thickened. Review of the axial images demonstrates no significant spinal canal stenosis. Multilevel facet arthrosis results in multilevel mild neuroforaminal narrowing, most severe on the right at C3-4. Upper chest: A sub solid spiculated density is seen within the visualized right apex, incompletely evaluated on this examination and indeterminate. Other: None IMPRESSION: No acute intracranial injury.  No calvarial fracture. No acute fracture or listhesis of the cervical spine. Spiculated density within the visualized right upper lobe, incompletely evaluated. This would be better assessed with dedicated imaging if indicated. Electronically Signed   By: Fidela Salisbury M.D.   On: 11/26/2020 23:31   CT Cervical Spine Wo Contrast  Result Date: 11/26/2020 CLINICAL DATA:  Witnessed fall, head and neck trauma EXAM: CT HEAD WITHOUT CONTRAST CT CERVICAL SPINE WITHOUT CONTRAST TECHNIQUE: Multidetector CT imaging  of the head and cervical spine was performed following the standard protocol without intravenous contrast. Multiplanar CT image reconstructions of the cervical spine were also generated. COMPARISON:  03/28/2019 FINDINGS: CT HEAD FINDINGS Brain: Normal anatomic configuration. Parenchymal volume loss is commensurate with the patient's age. Mild periventricular white matter changes are present likely reflecting the sequela of small vessel ischemia. No abnormal intra or extra-axial mass lesion or fluid collection. No abnormal mass effect or midline shift. No evidence of acute intracranial hemorrhage or infarct. Mild ventriculomegaly is stable. Cerebellum unremarkable. Vascular: No asymmetric  hyperdense vasculature at the skull base. Skull: Intact Sinuses/Orbits: Paranasal sinuses are clear. Ocular lenses have been removed. Orbits are otherwise unremarkable. Other: Mastoid air cells and middle ear cavities are clear. CT CERVICAL SPINE FINDINGS Alignment: Normal cervical lordosis. Skull base and vertebrae: Craniocervical alignment is normal. The atlantodental interval is not widened. Degenerative changes are noted at the atlantodental articulation. No acute fracture of the cervical spine. Vertebral body height is preserved. Soft tissues and spinal canal: No prevertebral fluid or swelling. No visible canal hematoma. Disc levels: There is diffuse intervertebral disc space narrowing and endplate remodeling throughout the cervical spine in keeping with severe degenerative disc disease. The prevertebral soft tissues are not thickened. Review of the axial images demonstrates no significant spinal canal stenosis. Multilevel facet arthrosis results in multilevel mild neuroforaminal narrowing, most severe on the right at C3-4. Upper chest: A sub solid spiculated density is seen within the visualized right apex, incompletely evaluated on this examination and indeterminate. Other: None IMPRESSION: No acute intracranial injury.  No calvarial fracture. No acute fracture or listhesis of the cervical spine. Spiculated density within the visualized right upper lobe, incompletely evaluated. This would be better assessed with dedicated imaging if indicated. Electronically Signed   By: Fidela Salisbury M.D.   On: 11/26/2020 23:31   MR TOES LEFT WO CONTRAST  Result Date: 11/27/2020 CLINICAL DATA:  Osteomyelitis EXAM: MRI OF THE LEFT TOES WITHOUT CONTRAST TECHNIQUE: Multiplanar, multisequence MR imaging of the left toes was performed. No intravenous contrast was administered. COMPARISON:  X-ray 11/26/2020 FINDINGS: Nondiagnostic examination as patient was unable to tolerate scanning. Multiple attempts were made to to  assist patient with the exam and were unsuccessful. Two partial markedly motion degraded sequences were submitted which appears to demonstrate marrow edema in the region of the great toe interphalangeal joint, although evaluation for osteomyelitis is nondiagnostic (series 4, image 11). IMPRESSION: 1. Nondiagnostic examination.  See above. 2. Apparent marrow edema in the region of the great toe interphalangeal joint, although evaluation for osteomyelitis is nondiagnostic. Electronically Signed   By: Davina Poke D.O.   On: 11/27/2020 12:09   DG Foot Complete Left  Result Date: 11/26/2020 CLINICAL DATA:  Ingrown infected big toe.  Redness and swelling. EXAM: LEFT FOOT - COMPLETE 3+ VIEW COMPARISON:  None. FINDINGS: Vascular calcifications are identified. There is rare furcation of mild irregularity of the cortex at the base of the distal first phalanx. No other abnormalities. IMPRESSION: Osteomyelitis not excluded in the proximal aspect of the distal first phalanx. Recommend MRI for further assessment. Electronically Signed   By: Dorise Bullion III M.D.   On: 11/26/2020 22:45   VAS [place] ABI WITH/WO TBI  Result Date: 11/27/2020  LOWER EXTREMITY DOPPLER STUDY Patient Name:  Christopher Gross  Date of Exam:   11/27/2020 Medical Rec #: [id number]      Accession #:    [id number] Date of Birth: 28-Apr-1924  Patient Gender: M Patient Age:   62 years Exam Location:  [facility] Procedure:      VAS [place] ABI WITH/WO TBI Referring Phys: JARED GARDNER --------------------------------------------------------------------------------  Indications: Ulceration. High Risk Factors: None.  Limitations: Today's exam was limited due to patient positioning, patient unable              to lie flat and constant patient movement, poor patient              cooperation, patient confusion. Comparison Study: No prior studies. Performing Technologist: Carlos Levering RVT  Examination Guidelines: A complete evaluation includes at  minimum, Doppler waveform signals and systolic blood pressure reading at the level of bilateral brachial, anterior tibial, and posterior tibial arteries, when vessel segments are accessible. Bilateral testing is considered an integral part of a complete examination. Photoelectric Plethysmograph (PPG) waveforms and toe systolic pressure readings are included as required and additional duplex testing as needed. Limited examinations for reoccurring indications may be performed as noted.  ABI Findings: +---------+------------------+-----+---------+----------------+ Right    Rt Pressure (mmHg)IndexWaveform Comment          +---------+------------------+-----+---------+----------------+ Brachial 183                    triphasic                 +---------+------------------+-----+---------+----------------+ PTA                                      Unable to assess +---------+------------------+-----+---------+----------------+ DP       254               1.39 biphasic                  +---------+------------------+-----+---------+----------------+ Great Toe                                Unable to assess +---------+------------------+-----+---------+----------------+ +---------+------------------+-----+--------+----------------+ Left     Lt Pressure (mmHg)IndexWaveformComment          +---------+------------------+-----+--------+----------------+ PTA                                     Unable to assess +---------+------------------+-----+--------+----------------+ DP       150               0.82 biphasic                 +---------+------------------+-----+--------+----------------+ Great Toe                               Unable to assess +---------+------------------+-----+--------+----------------+ +-------+-----------+-----------+------------+------------+ ABI/TBIToday's ABIToday's TBIPrevious ABIPrevious TBI  +-------+-----------+-----------+------------+------------+ Right  [place]                                             +-------+-----------+-----------+------------+------------+ Left   0.82                                           +-------+-----------+-----------+------------+------------+   Summary: Right: Resting  right ankle-brachial index indicates noncompressible right lower extremity arteries. Values are likely innacurate due to the above listed limitations. Unable to obtain posterior tibial artery values due to patient positioning and movement. Unable to obtain TBI due to constant patient movement. Left: Resting left ankle-brachial index indicates mild left lower extremity arterial disease. Values are likely innacurate due to the above listed limitations. Unable to obtain posterior tibial artery values due to patient positioning and movement. Unable to obtain TBI due to constant patient movement.  *See table(s) above for measurements and observations.  Electronically signed by Heath Lark on 11/27/2020 at 11:07:00 AM.    Final     Review of Systems Blood pressure (!) 154/75, pulse (!) 110, temperature (!) 97.5 F (36.4 C), temperature source Oral, resp. rate 20, height 5\' 4"  (1.626 m), weight 49.9 kg, SpO2 97 %. Physical Exam General: NAD  Dermatological: On the left hallux and there is mild erythema along the proximal nail fold.  Incurvation of the nail folds.  There is no drainage or pus identified.  The nail itself is dystrophic with yellow to brown discoloration.  There is no fluctuance or crepitation.  There is no malodor.  No open sores identified bilaterally.  Vascular: Dorsalis Pedis artery and Posterior Tibial artery pedal pulses are nonpalpable bilateral with immedate capillary fill time.  Calves are supple.  Neruologic: Unable to assess.  Musculoskeletal: Mild discomfort of the left hallux toenail upon palpation.  Assessment/Plan: Ingrown toenail localized  erythema  Clinically it does not appear to be osteomyelitis and the erythema is more from the ingrown nail.  Ideally I would remove the toenail but given his mental status as well as circulation likely not possible.  I would recommend cephalexin orally as well as mupirocin ointment dressing changes around the nail.  Monitor for any signs or symptoms of worsening infection.  I can follow-up with him as an outpatient.  Podiatry to sign off.  Please let know if there are any changes.   Attempted to call patient's wife with the phone number in the chart but the phone kept ringing and no answer.   [place] 11/28/2020, 11:19 AM

## 2020-11-28 NOTE — Assessment & Plan Note (Addendum)
Appreciate palliative assistance, planning comfort feeds with precaution.  Follow in hospital for next day or so, continuing antibiotics.  Follow course for next day or so, then make further decisions regarding SNF with palliative vs hospice.  See 11/22 palliative notes.

## 2020-11-28 NOTE — Assessment & Plan Note (Addendum)
CXR concerning for pneumonia with bibasilar infiltrates with pneumonia or aspiration blood culture Continue ceftriaxone, add flagyl SLP eval

## 2020-11-28 NOTE — Progress Notes (Signed)
Initial Nutrition Assessment  DOCUMENTATION CODES:   Not applicable  INTERVENTION:   -Increase Ensure Enlive po TID, each supplement provides 350 kcal and 20 grams of protein  -MVI with minerals daily -Feeding assistance with meals -Downgrade diet to dysphagia 3 (advanced mechanical soft) for ease of intake  NUTRITION DIAGNOSIS:   Inadequate oral intake related to decreased appetite as evidenced by meal completion < 25%.  GOAL:   Patient will meet greater than or equal to 90% of their needs  MONITOR:   PO intake, Supplement acceptance, Labs, Weight trends, Skin, I & O's  REASON FOR ASSESSMENT:   Consult Wound healing  ASSESSMENT:   Christopher Gross is a 87 y.o. male with medical history significant of BPH, dementia.  Pt admitted with great toe infection.   Reviewed I/O's: -873 ml x 24 hours  UOP: 1.1 L x 24 hours  Pt unavailable at time of visit. Attempted to speak with pt via call to hospital room phone, however, unable to reach. RD unable to obtain further nutrition-related history or complete nutrition-focused physical exam at this time.     Per podiatry notes, suspect osteomyelitis not present. Pt with ingrown toenail localized erythema. No plans to remove toenail.   Pt currently on a heart healthy diet with poor oral intake. Noted meal completions 10%. Pt is taking Ensure supplements.   Reviewed wt hx; pt has experienced a 7.6% wt loss over the past 9 months. While not significant for time frame, it is concerning given dementia and advanced age.   Medications reviewed.   Labs reviewed.   Diet Order:   Diet Order             Diet Heart Room service appropriate? Yes; Fluid consistency: Thin  Diet effective now                   EDUCATION NEEDS:   No education needs have been identified at this time  Skin:  Skin Assessment: Reviewed RN Assessment  Last BM:  11/25/20  Height:   Ht Readings from Last 1 Encounters:  11/26/20 5\' 4"  (1.626 m)     Weight:   Wt Readings from Last 1 Encounters:  11/26/20 49.9 kg    Ideal Body Weight:  59.1 kg  BMI:  Body mass index is 18.88 kg/m.  Estimated Nutritional Needs:   Kcal:  1550-1750  Protein:  75-90 grams  Fluid:  > 1.5 L    11/28/20, RD, LDN, CDCES Registered Dietitian II Certified Diabetes Care and Education Specialist Please refer to [facility] for RD and/or RD on-call/weekend/after hours pager

## 2020-11-28 NOTE — Progress Notes (Signed)
PROGRESS NOTE    Christopher Gross  [id number] DOB: 1924-01-25 DOA: 11/26/2020 PCP: Janith Lima, MD   Chief Complaint  Patient presents with   Fall    Brief Narrative:   Christopher Gross is Christopher Gross 87 y.o. male with medical history significant of BPH, dementia.   Pt presents to ED for mechanical fall.  Had fall at home this evening, hit head.  Skin tear to L elbow.  Unknown if LOC.  No CP, no SOB. No fevers/chills.   Has been having L big toe pain.   Family notes that he has ingrown toe nail and they are concerned about infection.     ED Course: No obvious major injury from fall on work up.   Does have ? Of osteomyelitis of L great distal phalynx on X ray though.  MRI recd.  Pt started on zosyn in ED.   During ED stay, became more confused.  Got restraints and ativan to prevent falling out of bed.   Looks like anxiety / confusion / sundowning is chronic for patient as described in his PCPs note from April.  Assessment & Plan:   Principal Problem:   Infection of great toe Active Problems:   Goals of care, counseling/discussion   Acute encephalopathy   Dementia ([place])   Fall at home, initial encounter   Pneumonia   Dysphagia   Abnormal CT scan  * Infection of great toe MRI nondiagnostic Marrow edema in region of great toe IP joint CRP 5.6 Sed rate 57 Discussed with podiatry who will see him -> recommending keflex, mupirocin ointment, dressing changes around nail Will continue abx (will leave on ceftriaxone for now with pneumonia) ABI's with noncompressible RLE arteries and mild left lower extremity arterial disease - limited exam, see report   Goals of care, counseling/discussion Discussed with wife of 50 years.  She thinks DNR is appropriate.  Discussed need for additional goc conversations - especially with his encephalopathy/delirium, will have palliative care involved as well.    Acute encephalopathy Likely 2/2 acute hospitalization, dementia He's in  restraints Prn ativan low dose Delirium precautions   Pneumonia CXR concerning for pneumonia with bibasilar infiltrates with pneumonia or aspiration Needs follow up imaging Continue ceftriaxone SLP eval  Fall at home, initial encounter CT head/neck without acute finding Consider PT/OT if he's more cooperative  Dementia [facility]) Delirium precautions  Dysphagia SLP eval, NPO  Abnormal CT scan Spiculated density of visualized right upper lobe, will discuss with family   DVT prophylaxis: lovenox Code Status: full  Family Communication: none at bedside Disposition:   Status is: Observation  The patient will require care spanning > 2 midnights and should be moved to inpatient because: need for IV abx and podiatry eval   Consultants:  podiatry  Procedures:  ABI Summary:  Right: Resting right ankle-brachial index indicates noncompressible right  lower extremity arteries.   Values are likely innacurate due to the above listed limitations. Unable  to obtain posterior tibial artery values due to patient positioning and  movement. Unable to obtain TBI due to constant patient movement.  Left: Resting left ankle-brachial index indicates mild left lower  extremity arterial disease.   Values are likely innacurate due to the above listed limitations. Unable  to obtain posterior tibial artery values due to patient positioning and  movement. Unable to obtain TBI due to constant patient movement.     *See table(s) above for measurements and observations.   Antimicrobials:  Anti-infectives (From admission, onward)  Start     Dose/Rate Route Frequency Ordered Stop   11/28/20 1800  cefTRIAXone (ROCEPHIN) 1 g in sodium chloride 0.9 % 100 mL IVPB        1 g 200 mL/hr over 30 Minutes Intravenous Every 24 hours 11/28/20 1649     11/28/20 1215  ceFAZolin (ANCEF) IVPB 1 g/50 mL premix  Status:  Discontinued        1 g 100 mL/hr over 30 Minutes Intravenous Every 12 hours 11/28/20  1125 11/28/20 1649   11/27/20 1000  cefTRIAXone (ROCEPHIN) 2 g in sodium chloride 0.9 % 100 mL IVPB  Status:  Discontinued       See Hyperspace for full Linked Orders Report.   2 g 200 mL/hr over 30 Minutes Intravenous Every 24 hours 11/26/20 2349 11/28/20 1122   11/27/20 1000  metroNIDAZOLE (FLAGYL) IVPB 500 mg  Status:  Discontinued        500 mg 100 mL/hr over 60 Minutes Intravenous Every 12 hours 11/27/20 0048 11/28/20 1122   11/27/20 0000  metroNIDAZOLE (FLAGYL) tablet 500 mg  Status:  Discontinued       See Hyperspace for full Linked Orders Report.   500 mg Oral Every 12 hours 11/26/20 2349 11/27/20 0048   11/26/20 2345  piperacillin-tazobactam (ZOSYN) IVPB 3.375 g        3.375 g 100 mL/hr over 30 Minutes Intravenous  Once 11/26/20 2335 11/27/20 0013          Subjective: Lethargic when I saw him today  Objective: Vitals:   11/27/20 2022 11/27/20 2329 11/28/20 0408 11/28/20 1454  BP: (!) 164/78 (!) 147/83 (!) 154/75 (!) 156/65  Pulse: 92 84 (!) 110 99  Resp: 18 18 20 20   Temp: 98.1 F (36.7 C) 97.9 F (36.6 C) (!) 97.5 F (36.4 C) (!) 97.4 F (36.3 C)  TempSrc: Oral Oral Oral Oral  SpO2: 98% 99% 97% 99%  Weight:      Height:        Intake/Output Summary (Last 24 hours) at 11/28/2020 1655 Last data filed at 11/28/2020 0900 Gross per 24 hour  Intake 117 ml  Output 1050 ml  Net -933 ml   Filed Weights   11/26/20 2153  Weight: 49.9 kg    Examination:  General: No acute distress. Cardiovascular: RRR Lungs: unlabored Abdomen: Soft, nontender, nondistended Neurological: lethargic, says some unintelligible words mostly  Skin: Warm and dry. No rashes or lesions. Extremities: No clubbing or cyanosis. No edema.     Data Reviewed: I have personally reviewed following labs and imaging studies  CBC: Recent Labs  Lab 11/26/20 2249 11/27/20 0423 11/28/20 0555  WBC 5.4 5.9 9.2  NEUTROABS  --   --  7.9*  HGB 11.5* 11.6* 10.7*  HCT 35.0* 35.7* 32.5*  MCV  91.6 93.0 91.0  PLT 226 253 [id number]    Basic Metabolic Panel: Recent Labs  Lab 11/26/20 2249 11/27/20 0423 11/28/20 0555  NA 136 138 140  K 3.9 3.6 3.5  CL 102 104 106  CO2 27 27 22   GLUCOSE 99 88 71  BUN 36* 33* 30*  CREATININE 1.18 1.26* 1.25*  CALCIUM 9.2 9.1 8.8*  MG  --   --  1.9  PHOS  --   --  3.8    GFR: Estimated Creatinine Clearance: 25.5 mL/min (Beatriz Quintela) (by C-G formula based on SCr of 1.25 mg/dL (H)).  Liver Function Tests: Recent Labs  Lab 11/28/20 0555  AST 26  ALT 18  ALKPHOS  89  BILITOT 1.0  PROT 6.5  ALBUMIN 3.0*    CBG: No results for input(s): GLUCAP in the last 168 hours.   Recent Results (from the past 240 hour(s))  MRSA Next Gen by PCR, Nasal     Status: None   Collection Time: 11/26/20 11:51 PM   Specimen: Nasal Mucosa; Nasal Swab  Result Value Ref Range Status   MRSA by PCR Next Gen NOT DETECTED NOT DETECTED Final    Comment: (NOTE) The GeneXpert MRSA Assay (FDA approved for NASAL specimens only), is one component of Edrick Whitehorn comprehensive MRSA colonization surveillance program. It is not intended to diagnose MRSA infection nor to guide or monitor treatment for MRSA infections. Test performance is not FDA approved in patients less than 48 years old. Performed at [facility], [address] [place], [place] [address]   Resp Panel by RT-PCR (Flu Yahira Timberman&B, Covid) Nasopharyngeal Swab     Status: None   Collection Time: 11/27/20  1:31 AM   Specimen: Nasopharyngeal Swab; Nasopharyngeal(NP) swabs in vial transport medium  Result Value Ref Range Status   SARS Coronavirus 2 by RT PCR NEGATIVE NEGATIVE Final    Comment: (NOTE) SARS-CoV-2 target nucleic acids are NOT DETECTED.  The SARS-CoV-2 RNA is generally detectable in upper respiratory specimens during the acute phase of infection. The lowest concentration of SARS-CoV-2 viral copies this assay can detect is 138 copies/mL. Amea Mcphail negative result does not preclude SARS-Cov-2 infection and  should not be used as the sole basis for treatment or other patient management decisions. Keandria Berrocal negative result may occur with  improper specimen collection/handling, submission of specimen other than nasopharyngeal swab, presence of viral mutation(s) within the areas targeted by this assay, and inadequate number of viral copies(<138 copies/mL). Shenicka Sunderlin negative result must be combined with clinical observations, patient history, and epidemiological information. The expected result is Negative.  Fact Sheet for Patients:  BloggerCourse.com  Fact Sheet for Healthcare Providers:  SeriousBroker.it  This test is no t yet approved or cleared by the [place] FDA and  has been authorized for detection and/or diagnosis of SARS-CoV-2 by FDA under an Emergency Use Authorization (EUA). This EUA will remain  in effect (meaning this test can be used) for the duration of the COVID-19 declaration under Section 564(b)(1) of the Act, 21 U.S.C.section 360bbb-3(b)(1), unless the authorization is terminated  or revoked sooner.       Influenza Elgin Carn by PCR NEGATIVE NEGATIVE Final   Influenza B by PCR NEGATIVE NEGATIVE Final    Comment: (NOTE) The Xpert Xpress SARS-CoV-2/FLU/RSV plus assay is intended as an aid in the diagnosis of influenza from Nasopharyngeal swab specimens and should not be used as Mabrey Howland sole basis for treatment. Nasal washings and aspirates are unacceptable for Xpert Xpress SARS-CoV-2/FLU/RSV testing.  Fact Sheet for Patients: BloggerCourse.com  Fact Sheet for Healthcare Providers: SeriousBroker.it  This test is not yet approved or cleared by the [place] FDA and has been authorized for detection and/or diagnosis of SARS-CoV-2 by FDA under an Emergency Use Authorization (EUA). This EUA will remain in effect (meaning this test can be used) for the duration of the COVID-19 declaration  under Section 564(b)(1) of the Act, 21 U.S.C. section 360bbb-3(b)(1), unless the authorization is terminated or revoked.  Performed at [facility], [address], [place], [place] [address]          Radiology Studies: CT Head Wo Contrast  Result Date: 11/26/2020 CLINICAL DATA:  Witnessed fall, head and neck  trauma EXAM: CT HEAD WITHOUT CONTRAST CT CERVICAL SPINE WITHOUT CONTRAST TECHNIQUE: Multidetector CT imaging of the head and cervical spine was performed following the standard protocol without intravenous contrast. Multiplanar CT image reconstructions of the cervical spine were also generated. COMPARISON:  03/28/2019 FINDINGS: CT HEAD FINDINGS Brain: Normal anatomic configuration. Parenchymal volume loss is commensurate with the patient's age. Mild periventricular white matter changes are present likely reflecting the sequela of small vessel ischemia. No abnormal intra or extra-axial mass lesion or fluid collection. No abnormal mass effect or midline shift. No evidence of acute intracranial hemorrhage or infarct. Mild ventriculomegaly is stable. Cerebellum unremarkable. Vascular: No asymmetric hyperdense vasculature at the skull base. Skull: Intact Sinuses/Orbits: Paranasal sinuses are clear. Ocular lenses have been removed. Orbits are otherwise unremarkable. Other: Mastoid air cells and middle ear cavities are clear. CT CERVICAL SPINE FINDINGS Alignment: Normal cervical lordosis. Skull base and vertebrae: Craniocervical alignment is normal. The atlantodental interval is not widened. Degenerative changes are noted at the atlantodental articulation. No acute fracture of the cervical spine. Vertebral body height is preserved. Soft tissues and spinal canal: No prevertebral fluid or swelling. No visible canal hematoma. Disc levels: There is diffuse intervertebral disc space narrowing and endplate remodeling throughout the cervical spine in keeping with severe degenerative disc  disease. The prevertebral soft tissues are not thickened. Review of the axial images demonstrates no significant spinal canal stenosis. Multilevel facet arthrosis results in multilevel mild neuroforaminal narrowing, most severe on the right at C3-4. Upper chest: Kateena Degroote sub solid spiculated density is seen within the visualized right apex, incompletely evaluated on this examination and indeterminate. Other: None IMPRESSION: No acute intracranial injury.  No calvarial fracture. No acute fracture or listhesis of the cervical spine. Spiculated density within the visualized right upper lobe, incompletely evaluated. This would be better assessed with dedicated imaging if indicated. Electronically Signed   By: Fidela Salisbury M.D.   On: 11/26/2020 23:31   CT Cervical Spine Wo Contrast  Result Date: 11/26/2020 CLINICAL DATA:  Witnessed fall, head and neck trauma EXAM: CT HEAD WITHOUT CONTRAST CT CERVICAL SPINE WITHOUT CONTRAST TECHNIQUE: Multidetector CT imaging of the head and cervical spine was performed following the standard protocol without intravenous contrast. Multiplanar CT image reconstructions of the cervical spine were also generated. COMPARISON:  03/28/2019 FINDINGS: CT HEAD FINDINGS Brain: Normal anatomic configuration. Parenchymal volume loss is commensurate with the patient's age. Mild periventricular white matter changes are present likely reflecting the sequela of small vessel ischemia. No abnormal intra or extra-axial mass lesion or fluid collection. No abnormal mass effect or midline shift. No evidence of acute intracranial hemorrhage or infarct. Mild ventriculomegaly is stable. Cerebellum unremarkable. Vascular: No asymmetric hyperdense vasculature at the skull base. Skull: Intact Sinuses/Orbits: Paranasal sinuses are clear. Ocular lenses have been removed. Orbits are otherwise unremarkable. Other: Mastoid air cells and middle ear cavities are clear. CT CERVICAL SPINE FINDINGS Alignment: Normal cervical  lordosis. Skull base and vertebrae: Craniocervical alignment is normal. The atlantodental interval is not widened. Degenerative changes are noted at the atlantodental articulation. No acute fracture of the cervical spine. Vertebral body height is preserved. Soft tissues and spinal canal: No prevertebral fluid or swelling. No visible canal hematoma. Disc levels: There is diffuse intervertebral disc space narrowing and endplate remodeling throughout the cervical spine in keeping with severe degenerative disc disease. The prevertebral soft tissues are not thickened. Review of the axial images demonstrates no significant spinal canal stenosis. Multilevel facet arthrosis results in multilevel mild neuroforaminal narrowing, most  severe on the right at C3-4. Upper chest: Skylen Spiering sub solid spiculated density is seen within the visualized right apex, incompletely evaluated on this examination and indeterminate. Other: None IMPRESSION: No acute intracranial injury.  No calvarial fracture. No acute fracture or listhesis of the cervical spine. Spiculated density within the visualized right upper lobe, incompletely evaluated. This would be better assessed with dedicated imaging if indicated. Electronically Signed   By: Helyn Numbers M.D.   On: 11/26/2020 23:31   MR TOES LEFT WO CONTRAST  Result Date: 11/27/2020 CLINICAL DATA:  Osteomyelitis EXAM: MRI OF THE LEFT TOES WITHOUT CONTRAST TECHNIQUE: Multiplanar, multisequence MR imaging of the left toes was performed. No intravenous contrast was administered. COMPARISON:  X-ray 11/26/2020 FINDINGS: Nondiagnostic examination as patient was unable to tolerate scanning. Multiple attempts were made to to assist patient with the exam and were unsuccessful. Two partial markedly motion degraded sequences were submitted which appears to demonstrate marrow edema in the region of the great toe interphalangeal joint, although evaluation for osteomyelitis is nondiagnostic (series 4, image 11).  IMPRESSION: 1. Nondiagnostic examination.  See above. 2. Apparent marrow edema in the region of the great toe interphalangeal joint, although evaluation for osteomyelitis is nondiagnostic. Electronically Signed   By: Duanne Guess D.O.   On: 11/27/2020 12:09   DG CHEST PORT 1 VIEW  Result Date: 11/28/2020 CLINICAL DATA:  Cough.  Admitted 2 days ago after fall. EXAM: PORTABLE CHEST 1 VIEW COMPARISON:  February 13, 2020 FINDINGS: No pneumothorax. The cardiomediastinal silhouette is stable. New bibasilar infiltrates. No other acute abnormalities. IMPRESSION: New bibasilar infiltrates consistent with pneumonia or aspiration. Recommend short-term follow-up imaging after treatment to ensure resolution. Electronically Signed   By: Gerome Sam III M.D.   On: 11/28/2020 14:23   DG Foot Complete Left  Result Date: 11/26/2020 CLINICAL DATA:  Ingrown infected big toe.  Redness and swelling. EXAM: LEFT FOOT - COMPLETE 3+ VIEW COMPARISON:  None. FINDINGS: Vascular calcifications are identified. There is rare furcation of mild irregularity of the cortex at the base of the distal first phalanx. No other abnormalities. IMPRESSION: Osteomyelitis not excluded in the proximal aspect of the distal first phalanx. Recommend MRI for further assessment. Electronically Signed   By: Gerome Sam III M.D.   On: 11/26/2020 22:45   VAS [place] ABI WITH/WO TBI  Result Date: 11/27/2020  LOWER EXTREMITY DOPPLER STUDY Patient Name:  Christopher Gross  Date of Exam:   11/27/2020 Medical Rec #: [id number]      Accession #:    [id number] Date of Birth: 1924/04/30      Patient Gender: M Patient Age:   64 years Exam Location:  [facility] Procedure:      VAS [place] ABI WITH/WO TBI Referring Phys: Lyda Perone --------------------------------------------------------------------------------  Indications: Ulceration. High Risk Factors: None.  Limitations: Today's exam was limited due to patient positioning, patient unable              to  lie flat and constant patient movement, poor patient              cooperation, patient confusion. Comparison Study: No prior studies. Performing Technologist: Olen Cordial RVT  Examination Guidelines: Caswell Alvillar complete evaluation includes at minimum, Doppler waveform signals and systolic blood pressure reading at the level of bilateral brachial, anterior tibial, and posterior tibial arteries, when vessel segments are accessible. Bilateral testing is considered an integral part of Pria Klosinski complete examination. Photoelectric Plethysmograph (PPG) waveforms and toe systolic pressure readings are included  as required and additional duplex testing as needed. Limited examinations for reoccurring indications may be performed as noted.  ABI Findings: +---------+------------------+-----+---------+----------------+ Right    Rt Pressure (mmHg)IndexWaveform Comment          +---------+------------------+-----+---------+----------------+ Brachial 183                    triphasic                 +---------+------------------+-----+---------+----------------+ PTA                                      Unable to assess +---------+------------------+-----+---------+----------------+ DP       254               1.39 biphasic                  +---------+------------------+-----+---------+----------------+ Great Toe                                Unable to assess +---------+------------------+-----+---------+----------------+ +---------+------------------+-----+--------+----------------+ Left     Lt Pressure (mmHg)IndexWaveformComment          +---------+------------------+-----+--------+----------------+ PTA                                     Unable to assess +---------+------------------+-----+--------+----------------+ DP       150               0.82 biphasic                 +---------+------------------+-----+--------+----------------+ Great Toe                               Unable to assess  +---------+------------------+-----+--------+----------------+ +-------+-----------+-----------+------------+------------+ ABI/TBIToday's ABIToday's TBIPrevious ABIPrevious TBI +-------+-----------+-----------+------------+------------+ Right  [place]                                             +-------+-----------+-----------+------------+------------+ Left   0.82                                           +-------+-----------+-----------+------------+------------+   Summary: Right: Resting right ankle-brachial index indicates noncompressible right lower extremity arteries. Values are likely innacurate due to the above listed limitations. Unable to obtain posterior tibial artery values due to patient positioning and movement. Unable to obtain TBI due to constant patient movement. Left: Resting left ankle-brachial index indicates mild left lower extremity arterial disease. Values are likely innacurate due to the above listed limitations. Unable to obtain posterior tibial artery values due to patient positioning and movement. Unable to obtain TBI due to constant patient movement.  *See table(s) above for measurements and observations.  Electronically signed by Jamelle Haring on 11/27/2020 at 11:07:00 AM.    Final         Scheduled Meds:  enoxaparin (LOVENOX) injection  30 mg Subcutaneous Q24H   feeding supplement  237 mL Oral TID BM   multivitamin with minerals  1 tablet Oral Daily   mupirocin ointment   Topical BID  Continuous Infusions:  cefTRIAXone (ROCEPHIN)  IV       LOS: 0 days    Time spent: over 30 min    Fayrene Helper, MD [facility]   To contact the attending provider between 7A-7P or the covering provider during after hours 7P-7A, please log into the web site [email or web address] and access using universal [place] password for that web site. If you do not have the password, please call the hospital operator.  11/28/2020, 4:55 PM

## 2020-11-28 NOTE — Assessment & Plan Note (Addendum)
SLP eval, they're recommending comfort feeds

## 2020-11-28 NOTE — Progress Notes (Signed)
Pharmacy Antibiotic Note  Christopher Gross is a 87 y.o. male admitted on 11/26/2020 with toe infection.  Pharmacy has been consulted for Cefazolin dosing.  Plan: Cefazolin 1g IV q12h Monitor renal function and adjust as needed  Height: 5\' 4"  (162.6 cm) Weight: 49.9 kg (110 lb) IBW/kg (Calculated) : 59.2  Temp (24hrs), Avg:97.8 F (36.6 C), Min:97.5 F (36.4 C), Max:98.1 F (36.7 C)  Recent Labs  Lab 11/26/20 2249 11/27/20 0423 11/28/20 0555  WBC 5.4 5.9 9.2  CREATININE 1.18 1.26* 1.25*    Estimated Creatinine Clearance: 25.5 mL/min (A) (by C-G formula based on SCr of 1.25 mg/dL (H)).    Allergies  Allergen Reactions   Aricept [Donepezil] Other (See Comments)    Antimicrobials this admission: 11/18 Zosyn x 1 11/19 Ceftriaxone/Flagyl >> 11/20 11/20 Cefazolin >>  Dose adjustments this admission:  Microbiology results: 11/18 MRSA PCR: neg 11/19 COVID: neg 11/19 Flu: neg  Thank you for allowing pharmacy to be a part of this patient's care.  12/19, PharmD, BCPS Pharmacy: [phone] 11/28/2020 11:25 AM

## 2020-11-29 DIAGNOSIS — Z7189 Other specified counseling: Secondary | ICD-10-CM | POA: Diagnosis not present

## 2020-11-29 DIAGNOSIS — Z66 Do not resuscitate: Secondary | ICD-10-CM | POA: Diagnosis not present

## 2020-11-29 DIAGNOSIS — F0394 Unspecified dementia, unspecified severity, with anxiety: Secondary | ICD-10-CM | POA: Diagnosis present

## 2020-11-29 DIAGNOSIS — R531 Weakness: Secondary | ICD-10-CM | POA: Diagnosis not present

## 2020-11-29 DIAGNOSIS — L089 Local infection of the skin and subcutaneous tissue, unspecified: Secondary | ICD-10-CM | POA: Diagnosis present

## 2020-11-29 DIAGNOSIS — N400 Enlarged prostate without lower urinary tract symptoms: Secondary | ICD-10-CM | POA: Diagnosis present

## 2020-11-29 DIAGNOSIS — J690 Pneumonitis due to inhalation of food and vomit: Secondary | ICD-10-CM | POA: Diagnosis present

## 2020-11-29 DIAGNOSIS — W19XXXA Unspecified fall, initial encounter: Secondary | ICD-10-CM | POA: Diagnosis not present

## 2020-11-29 DIAGNOSIS — I10 Essential (primary) hypertension: Secondary | ICD-10-CM | POA: Diagnosis present

## 2020-11-29 DIAGNOSIS — L03032 Cellulitis of left toe: Secondary | ICD-10-CM | POA: Diagnosis present

## 2020-11-29 DIAGNOSIS — R1310 Dysphagia, unspecified: Secondary | ICD-10-CM | POA: Diagnosis present

## 2020-11-29 DIAGNOSIS — Z781 Physical restraint status: Secondary | ICD-10-CM | POA: Diagnosis not present

## 2020-11-29 DIAGNOSIS — Z888 Allergy status to other drugs, medicaments and biological substances status: Secondary | ICD-10-CM | POA: Diagnosis not present

## 2020-11-29 DIAGNOSIS — L600 Ingrowing nail: Secondary | ICD-10-CM | POA: Diagnosis present

## 2020-11-29 DIAGNOSIS — S51012A Laceration without foreign body of left elbow, initial encounter: Secondary | ICD-10-CM | POA: Diagnosis present

## 2020-11-29 DIAGNOSIS — E870 Hyperosmolality and hypernatremia: Secondary | ICD-10-CM | POA: Diagnosis not present

## 2020-11-29 DIAGNOSIS — F05 Delirium due to known physiological condition: Secondary | ICD-10-CM | POA: Diagnosis present

## 2020-11-29 DIAGNOSIS — E876 Hypokalemia: Secondary | ICD-10-CM | POA: Diagnosis not present

## 2020-11-29 DIAGNOSIS — G9340 Encephalopathy, unspecified: Secondary | ICD-10-CM | POA: Diagnosis present

## 2020-11-29 DIAGNOSIS — Y92009 Unspecified place in unspecified non-institutional (private) residence as the place of occurrence of the external cause: Secondary | ICD-10-CM | POA: Diagnosis not present

## 2020-11-29 DIAGNOSIS — Z515 Encounter for palliative care: Secondary | ICD-10-CM | POA: Diagnosis not present

## 2020-11-29 DIAGNOSIS — W1830XA Fall on same level, unspecified, initial encounter: Secondary | ICD-10-CM | POA: Diagnosis present

## 2020-11-29 DIAGNOSIS — R509 Fever, unspecified: Secondary | ICD-10-CM

## 2020-11-29 DIAGNOSIS — E860 Dehydration: Secondary | ICD-10-CM | POA: Diagnosis not present

## 2020-11-29 DIAGNOSIS — Z79899 Other long term (current) drug therapy: Secondary | ICD-10-CM | POA: Diagnosis not present

## 2020-11-29 DIAGNOSIS — Z20822 Contact with and (suspected) exposure to covid-19: Secondary | ICD-10-CM | POA: Diagnosis present

## 2020-11-29 LAB — URINALYSIS, COMPLETE (UACMP) WITH MICROSCOPIC
Bilirubin Urine: NEGATIVE
Glucose, UA: NEGATIVE mg/dL
Ketones, ur: 20 mg/dL — AB
Nitrite: NEGATIVE
Protein, ur: 30 mg/dL — AB
Specific Gravity, Urine: 1.016 (ref 1.005–1.030)
WBC, UA: >50 WBC/hpf — ABNORMAL HIGH (ref 0–5)
pH: 5 (ref 5.0–8.0)

## 2020-11-29 LAB — COMPREHENSIVE METABOLIC PANEL
ALT: 16 U/L (ref 0–44)
AST: 29 U/L (ref 15–41)
Albumin: 3 g/dL — ABNORMAL LOW (ref 3.5–5.0)
Alkaline Phosphatase: 86 U/L (ref 38–126)
Anion gap: 8 (ref 5–15)
BUN: 30 mg/dL — ABNORMAL HIGH (ref 8–23)
CO2: 25 mmol/L (ref 22–32)
Calcium: 9.2 mg/dL (ref 8.9–10.3)
Chloride: 112 mmol/L — ABNORMAL HIGH (ref 98–111)
Creatinine, Ser: 1.29 mg/dL — ABNORMAL HIGH (ref 0.61–1.24)
GFR, Estimated: 51 mL/min — ABNORMAL LOW (ref >60)
Glucose, Bld: 82 mg/dL (ref 70–99)
Potassium: 3.6 mmol/L (ref 3.5–5.1)
Sodium: 145 mmol/L (ref 135–145)
Total Bilirubin: 0.8 mg/dL (ref 0.3–1.2)
Total Protein: 6.6 g/dL (ref 6.5–8.1)

## 2020-11-29 LAB — CBC WITH DIFFERENTIAL/PLATELET
Abs Immature Granulocytes: 0.04 10*3/uL (ref 0.00–0.07)
Basophils Absolute: 0.1 10*3/uL (ref 0.0–0.1)
Basophils Relative: 1 %
Eosinophils Absolute: 0.1 10*3/uL (ref 0.0–0.5)
Eosinophils Relative: 1 %
HCT: 36 % — ABNORMAL LOW (ref 39.0–52.0)
Hemoglobin: 11.7 g/dL — ABNORMAL LOW (ref 13.0–17.0)
Immature Granulocytes: 0 %
Lymphocytes Relative: 8 %
Lymphs Abs: 0.8 10*3/uL (ref 0.7–4.0)
MCH: 29.7 pg (ref 26.0–34.0)
MCHC: 32.5 g/dL (ref 30.0–36.0)
MCV: 91.4 fL (ref 80.0–100.0)
Monocytes Absolute: 0.8 10*3/uL (ref 0.1–1.0)
Monocytes Relative: 8 %
Neutro Abs: 8.3 10*3/uL — ABNORMAL HIGH (ref 1.7–7.7)
Neutrophils Relative %: 82 %
Platelets: 271 10*3/uL (ref 150–400)
RBC: 3.94 MIL/uL — ABNORMAL LOW (ref 4.22–5.81)
RDW: 13.7 % (ref 11.5–15.5)
WBC: 10 10*3/uL (ref 4.0–10.5)
nRBC: 0 % (ref 0.0–0.2)

## 2020-11-29 MED ORDER — LORAZEPAM 2 MG/ML IJ SOLN
0.5000 mg | Freq: Four times a day (QID) | INTRAMUSCULAR | Status: DC | PRN
  Administered 2020-11-29 – 2020-12-05 (×6): 0.5 mg via INTRAVENOUS
  Filled 2020-11-29 (×7): qty 1

## 2020-11-29 MED ORDER — METRONIDAZOLE 500 MG/100ML IV SOLN
500.0000 mg | Freq: Two times a day (BID) | INTRAVENOUS | Status: DC
  Administered 2020-11-29 – 2020-12-06 (×14): 500 mg via INTRAVENOUS
  Filled 2020-11-29 (×14): qty 100

## 2020-11-29 MED ORDER — DEXTROSE IN LACTATED RINGERS 5 % IV SOLN: INTRAVENOUS | Status: DC

## 2020-11-29 NOTE — Evaluation (Signed)
Clinical/Bedside Swallow Evaluation Patient Details  Name: Christopher Gross MRN: [id number] Date of Birth: Dec 01, 1924  Today's Date: 11/29/2020 Time: SLP Start Time (ACUTE ONLY): 1005 SLP Stop Time (ACUTE ONLY): 1030 SLP Time Calculation (min) (ACUTE ONLY): 25 min  Past Medical History:  Past Medical History:  Diagnosis Date   BPH (benign prostatic hypertrophy)    Dementia (HCC)    Past Surgical History: History reviewed. No pertinent surgical history. HPI:  Patient is a 87 y.o. male with PMH: dementia, BPH who presented to ED for mechanical fall at home. He hit head, had skin tear to left elbow and unknown LOC. Patient has also been having left toe pain and family concerned about infection from ingrown toenail. In ED, patient became more confused requiring restraints and ativan to prevent from falling out of bed. CXR was concerning for PNA with bibasilar infiltrates. CT head and neck without acute finding.    Assessment / Plan / Recommendation  Clinical Impression  Patient presents with clinicial s/s of dysphagia as per this bedside swallow evaluation. He responded to SLP performing oral care by opening mouth when cued but also he was resistant to oral care when occuring and would groan/moan throughout. SLP removed a significant amount of dried secretions mainly on patient's hard and soft palate. Although PO's of water not directly given to patient, he did get small amount of water when SLP using toothette sponge. When water in mouth, he exhibited a gurlgled sounding voice and did not demonstrate initiation of swallow. Suspect patient's cognitive impairment from h/o dementia is impacting his ability to swallow and manage secretions. Baseline unknown at this time. SLP to f/u with patient to determine ability to take PO's as well as determine his baseline swallow function. SLP Visit Diagnosis: Dysphagia, unspecified (R13.10)    Aspiration Risk  Severe aspiration risk;Risk for inadequate  nutrition/hydration    Diet Recommendation NPO   Medication Administration: Via alternative means    Other  Recommendations Oral Care Recommendations: Oral care QID;Staff/trained caregiver to provide oral care    Recommendations for follow up therapy are one component of a multi-disciplinary discharge planning process, led by the attending physician.  Recommendations may be updated based on patient status, additional functional criteria and insurance authorization.  Follow up Recommendations Other (comment) (TBD pending GOC)      Assistance Recommended at Discharge Frequent or constant Supervision/Assistance  Functional Status Assessment Patient has had a recent decline in their functional status and demonstrates the ability to make significant improvements in function in a reasonable and predictable amount of time.  Frequency and Duration min 2x/week  1 week       Prognosis Prognosis for Safe Diet Advancement: Guarded Barriers to Reach Goals: Cognitive deficits Barriers/Prognosis Comment: h/o dementia and unknown baseline of swallow function      Swallow Study   General Date of Onset: 11/26/20 HPI: Patient is a 87 y.o. male with PMH: dementia, BPH who presented to ED for mechanical fall at home. He hit head, had skin tear to left elbow and unknown LOC. Patient has also been having left toe pain and family concerned about infection from ingrown toenail. In ED, patient became more confused requiring restraints and ativan to prevent from falling out of bed. CXR was concerning for PNA with bibasilar infiltrates. CT head and neck without acute finding. Type of Study: Bedside Swallow Evaluation Previous Swallow Assessment: none found Diet Prior to this Study: Dysphagia 3 (soft);Thin liquids Temperature Spikes Noted: No Respiratory Status: Room air  History of Recent Intubation: No Behavior/Cognition: Alert;Confused;Agitated;Distractible;Requires cueing Oral Cavity Assessment: Dried  secretions;Excessive secretions Oral Care Completed by SLP: Yes Oral Cavity - Dentition: Poor condition Self-Feeding Abilities: Total assist Patient Positioning: Upright in bed Baseline Vocal Quality: Other (comment) (difficulty to fully assess, no verbalizations only vocalizations when groaning) Volitional Cough: Cognitively unable to elicit Volitional Swallow: Unable to elicit    Oral/Motor/Sensory Function Overall Oral Motor/Sensory Function: Other (comment) (patient unable to follow directions for complete oral motor exam)   Ice Chips     Thin Liquid Thin Liquid: Impaired Presentation:  (toothette sponge) Other Comments: SLP performed extensive oral care with patient and he did not demonstrate ability to initiate swallow when small amount of water from toothette sponge in mouth.    Nectar Thick     Honey Thick     Puree Puree: Not tested   Solid     Solid: Not tested      Angela Nevin, MA, CCC-SLP Speech Therapy

## 2020-11-29 NOTE — Progress Notes (Signed)
PROGRESS NOTE    Christopher Christopher Gross  [id number] DOB: [birth year] DOA: 11/26/2020 PCP: Christopher Lima, MD   Chief Complaint  Patient presents with   Fall    Brief Narrative:   Christopher Christopher Gross is Christopher Christopher Gross 87 y.o. male with medical history significant of BPH, dementia.   Pt presents to ED for mechanical fall.  Had fall at home this evening, hit head.  Skin tear to L elbow.  Unknown if LOC.  No CP, no SOB. No fevers/chills.   Has been having L big toe pain.   Family notes that he has ingrown toe nail and they are concerned about infection.     ED Course: No obvious major injury from fall on work up.   Does have ? Of osteomyelitis of L great distal phalynx on X ray though.  MRI recd.  Pt started on zosyn in ED.   During ED stay, became more confused.  Got restraints and ativan to prevent falling out of bed.   Looks like anxiety / confusion / sundowning is chronic for patient as described in his PCPs note from April.  Assessment & Plan:   Principal Problem:   Infection of great toe Active Problems:   Goals of care, counseling/discussion   Acute encephalopathy   Pneumonia   Dementia ([place])   Fall at home, initial encounter   Dysphagia   Fever   Abnormal CT scan  * Infection of great toe MRI nondiagnostic Marrow edema in region of great toe IP joint CRP 5.6 Sed rate 57 Discussed with podiatry who will see him -> recommending keflex, mupirocin ointment, dressing changes around nail Will continue abx (will leave on ceftriaxone for now with pneumonia, add flagyl) ABI's with noncompressible RLE arteries and mild left lower extremity arterial disease - limited exam, see report   Goals of care, counseling/discussion Discussed with wife of 50 years.  She thinks DNR is appropriate.  Discussed with wife today, she saw him at bedside and is concerned overall about status.  Discussed difficulty of delirium/hospitalization and our concern for aspiration.  Palliative will reach out soon.   We're treating with abx for now.    Pneumonia CXR concerning for pneumonia with bibasilar infiltrates with pneumonia or aspiration Fever last night Add blood culture Continue ceftriaxone, add flagyl SLP eval  Acute encephalopathy Likely 2/2 acute hospitalization, dementia, infection Prn ativan low dose Delirium precautions   Dementia (HCC) Delirium precautions  Fever abx with ceftriaxone, flagyl Follow blood and urine cultures UA doesn't clearly look like Christopher Christopher Gross UTI CXR 11/20 with bibasilar infiltrates concerning for pneumonia or aspiration   Dysphagia SLP eval, they're recommending NPO  Fall at home, initial encounter CT head/neck without acute finding Consider PT/OT if he's more cooperative  Abnormal CT scan Spiculated density of visualized right upper lobe, will discuss with family --- at this time, can hold off on further workup  DVT prophylaxis: lovenox Code Status: full  Family Communication: none at bedside Disposition:   Status is: Observation  The patient will require care spanning > 2 midnights and should be moved to inpatient because: need for IV abx and podiatry eval   Consultants:  podiatry  Procedures:  ABI Summary:  Right: Resting right ankle-brachial index indicates noncompressible right  lower extremity arteries.   Values are likely innacurate due to the above listed limitations. Unable  to obtain posterior tibial artery values due to patient positioning and  movement. Unable to obtain TBI due to constant patient movement.  Left:  Resting left ankle-brachial index indicates mild left lower  extremity arterial disease.   Values are likely innacurate due to the above listed limitations. Unable  to obtain posterior tibial artery values due to patient positioning and  movement. Unable to obtain TBI due to constant patient movement.     *See table(s) above for measurements and observations.   Antimicrobials:  Anti-infectives (From admission,  onward)    Start     Dose/Rate Route Frequency Ordered Stop   11/29/20 1315  metroNIDAZOLE (FLAGYL) IVPB 500 mg        500 mg 100 mL/hr over 60 Minutes Intravenous Every 12 hours 11/29/20 1222     11/28/20 1800  cefTRIAXone (ROCEPHIN) 1 g in sodium chloride 0.9 % 100 mL IVPB        1 g 200 mL/hr over 30 Minutes Intravenous Every 24 hours 11/28/20 1649     11/28/20 1215  ceFAZolin (ANCEF) IVPB 1 g/50 mL premix  Status:  Discontinued        1 g 100 mL/hr over 30 Minutes Intravenous Every 12 hours 11/28/20 1125 11/28/20 1649   11/27/20 1000  cefTRIAXone (ROCEPHIN) 2 g in sodium chloride 0.9 % 100 mL IVPB  Status:  Discontinued       See Hyperspace for full Linked Orders Report.   2 g 200 mL/hr over 30 Minutes Intravenous Every 24 hours 11/26/20 2349 11/28/20 1122   11/27/20 1000  metroNIDAZOLE (FLAGYL) IVPB 500 mg  Status:  Discontinued        500 mg 100 mL/hr over 60 Minutes Intravenous Every 12 hours 11/27/20 0048 11/28/20 1122   11/27/20 0000  metroNIDAZOLE (FLAGYL) tablet 500 mg  Status:  Discontinued       See Hyperspace for full Linked Orders Report.   500 mg Oral Every 12 hours 11/26/20 2349 11/27/20 0048   11/26/20 2345  piperacillin-tazobactam (ZOSYN) IVPB 3.375 g        3.375 g 100 mL/hr over 30 Minutes Intravenous  Once 11/26/20 2335 11/27/20 0013          Subjective: Answers some basic questions No complaints of pain/discomfort  Objective: Vitals:   11/28/20 1454 11/28/20 2019 11/28/20 2335 11/29/20 1407  BP: (!) 156/65 (!) 153/71  (!) 137/59  Pulse: 99 82  91  Resp: 20 19  18   Temp: (!) 97.4 F (36.3 C) (!) 100.7 F (38.2 C) 98.4 F (36.9 C) 98.8 F (37.1 C)  TempSrc: Oral Oral Oral Axillary  SpO2: 99% 95%  97%  Weight:      Height:        Intake/Output Summary (Last 24 hours) at 11/29/2020 1622 Last data filed at 11/29/2020 1500 Christopher Gross per 24 hour  Intake 200 ml  Output 1200 ml  Net -1000 ml   Filed Weights   11/26/20 2153  Weight: 49.9 kg     Examination:  General: frail Cardiovascular: RRR Lungs: unlabored Abdomen: Soft, nontender, nondistended Neurological: hard of hearing, responds appropriately to some questions Skin: Warm and dry. No rashes or lesions. Extremities: toe with dressing    Data Reviewed: I have personally reviewed following labs and imaging studies  CBC: Recent Labs  Lab 11/26/20 2249 11/27/20 0423 11/28/20 0555 11/29/20 0847  WBC 5.4 5.9 9.2 10.0  NEUTROABS  --   --  7.9* 8.3*  HGB 11.5* 11.6* 10.7* 11.7*  HCT 35.0* 35.7* 32.5* 36.0*  MCV 91.6 93.0 91.0 91.4  PLT 226 253 240 271    Basic Metabolic Panel: Recent  Labs  Lab 11/26/20 2249 11/27/20 0423 11/28/20 0555 11/29/20 0847  NA 136 138 140 145  K 3.9 3.6 3.5 3.6  CL 102 104 106 112*  CO2 27 27 22 25   GLUCOSE 99 88 71 82  BUN 36* 33* 30* 30*  CREATININE 1.18 1.26* 1.25* 1.29*  CALCIUM 9.2 9.1 8.8* 9.2  MG  --   --  1.9  --   PHOS  --   --  3.8  --     GFR: Estimated Creatinine Clearance: 24.7 mL/min (Camyra Vaeth) (by C-G formula based on SCr of 1.29 mg/dL (H)).  Liver Function Tests: Recent Labs  Lab 11/28/20 0555 11/29/20 0847  AST 26 29  ALT 18 16  ALKPHOS 89 86  BILITOT 1.0 0.8  PROT 6.5 6.6  ALBUMIN 3.0* 3.0*    CBG: No results for input(s): GLUCAP in the last 168 hours.   Recent Results (from the past 240 hour(s))  MRSA Next Gen by PCR, Nasal     Status: None   Collection Time: 11/26/20 11:51 PM   Specimen: Nasal Mucosa; Nasal Swab  Result Value Ref Range Status   MRSA by PCR Next Gen NOT DETECTED NOT DETECTED Final    Comment: (NOTE) The GeneXpert MRSA Assay (FDA approved for NASAL specimens only), is one component of Tayton Decaire comprehensive MRSA colonization surveillance program. It is not intended to diagnose MRSA infection nor to guide or monitor treatment for MRSA infections. Test performance is not FDA approved in patients less than 60 years old. Performed at [facility], [place] [address], [place], [place] [address]   Resp Panel by RT-PCR (Flu Salayah Meares&B, Covid) Nasopharyngeal Swab     Status: None   Collection Time: 11/27/20  1:31 AM   Specimen: Nasopharyngeal Swab; Nasopharyngeal(NP) swabs in vial transport medium  Result Value Ref Range Status   SARS Coronavirus 2 by RT PCR NEGATIVE NEGATIVE Final    Comment: (NOTE) SARS-CoV-2 target nucleic acids are NOT DETECTED.  The SARS-CoV-2 RNA is generally detectable in upper respiratory specimens during the acute phase of infection. The lowest concentration of SARS-CoV-2 viral copies this assay can detect is 138 copies/mL. Myangel Summons negative result does not preclude SARS-Cov-2 infection and should not be used as the sole basis for treatment or other patient management decisions. Dagoberto Nealy negative result may occur with  improper specimen collection/handling, submission of specimen other than nasopharyngeal swab, presence of viral mutation(s) within the areas targeted by this assay, and inadequate number of viral copies(<138 copies/mL). Zanna Hawn negative result must be combined with clinical observations, patient history, and epidemiological information. The expected result is Negative.  Fact Sheet for Patients:  EntrepreneurPulse.com.au  Fact Sheet for Healthcare Providers:  IncredibleEmployment.be  This test is no t yet approved or cleared by the [place] FDA and  has been authorized for detection and/or diagnosis of SARS-CoV-2 by FDA under an Emergency Use Authorization (EUA). This EUA will remain  in effect (meaning this test can be used) for the duration of the COVID-19 declaration under Section 564(b)(1) of the Act, 21 U.S.C.section 360bbb-3(b)(1), unless the authorization is terminated  or revoked sooner.       Influenza Phoebe Marter by PCR NEGATIVE NEGATIVE Final   Influenza B by PCR NEGATIVE NEGATIVE Final    Comment: (NOTE) The Xpert Xpress SARS-CoV-2/FLU/RSV plus assay is intended as an aid in the  diagnosis of influenza from Nasopharyngeal swab specimens and should not be used as Cree Kunert sole basis for treatment. Nasal washings and aspirates  are unacceptable for Xpert Xpress SARS-CoV-2/FLU/RSV testing.  Fact Sheet for Patients: EntrepreneurPulse.com.au  Fact Sheet for Healthcare Providers: IncredibleEmployment.be  This test is not yet approved or cleared by the [place] FDA and has been authorized for detection and/or diagnosis of SARS-CoV-2 by FDA under an Emergency Use Authorization (EUA). This EUA will remain in effect (meaning this test can be used) for the duration of the COVID-19 declaration under Section 564(b)(1) of the Act, 21 U.S.C. section 360bbb-3(b)(1), unless the authorization is terminated or revoked.  Performed at [facility], [place] [address], [place], [place] [address]          Radiology Studies: DG CHEST PORT 1 VIEW  Result Date: 11/28/2020 CLINICAL DATA:  Cough.  Admitted 2 days ago after fall. EXAM: PORTABLE CHEST 1 VIEW COMPARISON:  February 13, 2020 FINDINGS: No pneumothorax. The cardiomediastinal silhouette is stable. New bibasilar infiltrates. No other acute abnormalities. IMPRESSION: New bibasilar infiltrates consistent with pneumonia or aspiration. Recommend short-term follow-up imaging after treatment to ensure resolution. Electronically Signed   By: Dorise Bullion III M.D.   On: 11/28/2020 14:23        Scheduled Meds:  enoxaparin (LOVENOX) injection  30 mg Subcutaneous Q24H   feeding supplement  237 mL Oral TID BM   multivitamin with minerals  1 tablet Oral Daily   mupirocin ointment   Topical BID   Continuous Infusions:  cefTRIAXone (ROCEPHIN)  IV Stopped (11/28/20 1955)   dextrose 5% lactated ringers     metronidazole 500 mg (11/29/20 1309)     LOS: 0 days    Time spent: over 30 min    Fayrene Helper, MD [facility]   To contact the attending provider between  7A-7P or the covering provider during after hours 7P-7A, please log into the web site [email or web address] and access using universal [place] password for that web site. If you do not have the password, please call the hospital operator.  11/29/2020, 4:22 PM

## 2020-11-29 NOTE — Assessment & Plan Note (Addendum)
abx with ceftriaxone, flagyl Follow blood and urine cultures UA doesn't clearly look like Jurnei Latini UTI - no growth in urine cx CXR 11/20 with bibasilar infiltrates concerning for pneumonia or aspiration

## 2020-11-30 DIAGNOSIS — Z7189 Other specified counseling: Secondary | ICD-10-CM

## 2020-11-30 DIAGNOSIS — E870 Hyperosmolality and hypernatremia: Secondary | ICD-10-CM

## 2020-11-30 LAB — CBC
HCT: 41 % (ref 39.0–52.0)
Hemoglobin: 12.7 g/dL — ABNORMAL LOW (ref 13.0–17.0)
MCH: 30.2 pg (ref 26.0–34.0)
MCHC: 31 g/dL (ref 30.0–36.0)
MCV: 97.6 fL (ref 80.0–100.0)
Platelets: 276 10*3/uL (ref 150–400)
RBC: 4.2 MIL/uL — ABNORMAL LOW (ref 4.22–5.81)
RDW: 14.1 % (ref 11.5–15.5)
WBC: 7.4 10*3/uL (ref 4.0–10.5)
nRBC: 0 % (ref 0.0–0.2)

## 2020-11-30 LAB — BASIC METABOLIC PANEL
Anion gap: 15 (ref 5–15)
BUN: 31 mg/dL — ABNORMAL HIGH (ref 8–23)
CO2: 21 mmol/L — ABNORMAL LOW (ref 22–32)
Calcium: 9.2 mg/dL (ref 8.9–10.3)
Chloride: 110 mmol/L (ref 98–111)
Creatinine, Ser: 1.16 mg/dL (ref 0.61–1.24)
GFR, Estimated: 58 mL/min — ABNORMAL LOW (ref >60)
Glucose, Bld: 91 mg/dL (ref 70–99)
Potassium: 3.3 mmol/L — ABNORMAL LOW (ref 3.5–5.1)
Sodium: 146 mmol/L — ABNORMAL HIGH (ref 135–145)

## 2020-11-30 LAB — URINE CULTURE: Culture: NO GROWTH

## 2020-11-30 MED ORDER — HALOPERIDOL LACTATE 5 MG/ML IJ SOLN
0.5000 mg | Freq: Once | INTRAMUSCULAR | Status: AC | PRN
  Administered 2020-11-30: 0.5 mg via INTRAVENOUS
  Filled 2020-11-30: qty 1

## 2020-11-30 MED ORDER — DEXTROSE 5 % IV SOLN: INTRAVENOUS | Status: DC

## 2020-11-30 MED ORDER — HALOPERIDOL LACTATE 5 MG/ML IJ SOLN
0.5000 mg | Freq: Once | INTRAMUSCULAR | Status: AC | PRN
  Administered 2020-11-30: 0.5 mg via INTRAMUSCULAR
  Filled 2020-11-30: qty 1

## 2020-11-30 NOTE — Assessment & Plan Note (Signed)
2/2 dehydration, hypotonic fluids, follow

## 2020-11-30 NOTE — Progress Notes (Signed)
PROGRESS NOTE    Christopher Gross  [id number] DOB: 1924-08-02 DOA: 11/26/2020 PCP: Christopher Lima, MD   Chief Complaint  Patient presents with   Fall    Brief Narrative:   Christopher Gross is Christopher Gross 87 y.o. male with medical history significant of BPH, dementia who presented with Christopher Gross mechanical fall.  He was noted to have ingrown toenail on L first toe with erythema of first toe and was started on abx with podiatry consult.  Podiatry recommending abx only and with low suspicion for osteo. Hospitalization complicated by delirium and aspiration pneumonia.  Palliative care is following, now planning for comfort feeds, will see how he does on abx next day or so and continue further goc discussions.     Assessment & Plan:   Principal Problem:   Fall at home, initial encounter Active Problems:   Fall   Goals of care, counseling/discussion   Pneumonia   Acute encephalopathy   Dementia ([place])   Infection of great toe   Dysphagia   Fever   Hypernatremia   Abnormal CT scan  * Fall at home, initial encounter CT head/neck without acute finding Consider PT/OT if he's more cooperative  Goals of care, counseling/discussion Appreciate palliative assistance, planning comfort feeds with precaution.  Follow in hospital for next day or so, continuing antibiotics.  Follow course for next day or so, then make further decisions regarding SNF with palliative vs hospice.  See 11/22 palliative notes.  Pneumonia CXR concerning for pneumonia with bibasilar infiltrates with pneumonia or aspiration blood culture Continue ceftriaxone, add flagyl SLP eval  Dementia ([place]) Delirium precautions  Acute encephalopathy Likely 2/2 acute hospitalization, dementia, infection Prn ativan low dose Delirium precautions   Fever abx with ceftriaxone, flagyl Follow blood and urine cultures UA doesn't clearly look like Christopher Gross UTI - no growth in urine cx CXR 11/20 with bibasilar infiltrates concerning for pneumonia or  aspiration   Dysphagia SLP eval, they're recommending comfort feeds  Infection of great toe MRI nondiagnostic Marrow edema in region of great toe IP joint CRP 5.6 Sed rate 57 Discussed with podiatry who will see him -> recommending keflex, mupirocin ointment, dressing changes around nail Will continue abx (will leave on ceftriaxone for now with pneumonia, add flagyl) ABI's with noncompressible RLE arteries and mild left lower extremity arterial disease - limited exam, see report   Hypernatremia 2/2 dehydration, hypotonic fluids, follow  Abnormal CT scan Spiculated density of visualized right upper lobe, will discuss with family --- at this time, can hold off on further workup  DVT prophylaxis: lovenox Code Status: full  Family Communication: none at bedside Disposition:   Status is: Observation  The patient will require care spanning > 2 midnights and should be moved to inpatient because: need for IV abx and podiatry eval   Consultants:  podiatry  Procedures:  ABI Summary:  Right: Resting right ankle-brachial index indicates noncompressible right  lower extremity arteries.   Values are likely innacurate due to the above listed limitations. Unable  to obtain posterior tibial artery values due to patient positioning and  movement. Unable to obtain TBI due to constant patient movement.  Left: Resting left ankle-brachial index indicates mild left lower  extremity arterial disease.   Values are likely innacurate due to the above listed limitations. Unable  to obtain posterior tibial artery values due to patient positioning and  movement. Unable to obtain TBI due to constant patient movement.     *See table(s) above for measurements and  observations.   Antimicrobials:  Anti-infectives (From admission, onward)    Start     Dose/Rate Route Frequency Ordered Stop   11/29/20 1315  metroNIDAZOLE (FLAGYL) IVPB 500 mg        500 mg 100 mL/hr over 60 Minutes Intravenous  Every 12 hours 11/29/20 1222     11/28/20 1800  cefTRIAXone (ROCEPHIN) 1 g in sodium chloride 0.9 % 100 mL IVPB        1 g 200 mL/hr over 30 Minutes Intravenous Every 24 hours 11/28/20 1649     11/28/20 1215  ceFAZolin (ANCEF) IVPB 1 g/50 mL premix  Status:  Discontinued        1 g 100 mL/hr over 30 Minutes Intravenous Every 12 hours 11/28/20 1125 11/28/20 1649   11/27/20 1000  cefTRIAXone (ROCEPHIN) 2 g in sodium chloride 0.9 % 100 mL IVPB  Status:  Discontinued       See Hyperspace for full Linked Orders Report.   2 g 200 mL/hr over 30 Minutes Intravenous Every 24 hours 11/26/20 2349 11/28/20 1122   11/27/20 1000  metroNIDAZOLE (FLAGYL) IVPB 500 mg  Status:  Discontinued        500 mg 100 mL/hr over 60 Minutes Intravenous Every 12 hours 11/27/20 0048 11/28/20 1122   11/27/20 0000  metroNIDAZOLE (FLAGYL) tablet 500 mg  Status:  Discontinued       See Hyperspace for full Linked Orders Report.   500 mg Oral Every 12 hours 11/26/20 2349 11/27/20 0048   11/26/20 2345  piperacillin-tazobactam (ZOSYN) IVPB 3.375 g        3.375 g 100 mL/hr over 30 Minutes Intravenous  Once 11/26/20 2335 11/27/20 0013          Subjective: Pleasantly confused  Objective: Vitals:   11/29/20 1407 11/29/20 2029 11/30/20 0346 11/30/20 1417  BP: (!) 137/59 (!) 164/106 (!) 144/116 (!) 168/93  Pulse: 91 88 89 94  Resp: 18 20 (!) 22 20  Temp: 98.8 F (37.1 C) 98.6 F (37 C) 98.4 F (36.9 C)   TempSrc: Axillary Axillary Axillary   SpO2: 97% 98% 95%   Weight:      Height:        Intake/Output Summary (Last 24 hours) at 11/30/2020 2124 Last data filed at 11/30/2020 1725 Gross per 24 hour  Intake 1031.52 ml  Output 1101 ml  Net -69.48 ml   Filed Weights   11/26/20 2153  Weight: 49.9 kg    Examination:  General: No acute distress. Cardiovascular: RRR Lungs: unlabored Abdomen: Soft, nontender, nondistended  Neurological: hard of hearing, diffiuclt to understand, moving all extremities,  pleasantly confused Extremities: no lee     Data Reviewed: I have personally reviewed following labs and imaging studies  CBC: Recent Labs  Lab 11/26/20 2249 11/27/20 0423 11/28/20 0555 11/29/20 0847 11/30/20 0501  WBC 5.4 5.9 9.2 10.0 7.4  NEUTROABS  --   --  7.9* 8.3*  --   HGB 11.5* 11.6* 10.7* 11.7* 12.7*  HCT 35.0* 35.7* 32.5* 36.0* 41.0  MCV 91.6 93.0 91.0 91.4 97.6  PLT 226 253 240 271 [id number]    Basic Metabolic Panel: Recent Labs  Lab 11/26/20 2249 11/27/20 0423 11/28/20 0555 11/29/20 0847 11/30/20 0501  NA 136 138 140 145 146*  K 3.9 3.6 3.5 3.6 3.3*  CL 102 104 106 112* 110  CO2 27 27 22 25  21*  GLUCOSE 99 88 71 82 91  BUN 36* 33* 30* 30* 31*  CREATININE 1.18 1.26*  1.25* 1.29* 1.16  CALCIUM 9.2 9.1 8.8* 9.2 9.2  MG  --   --  1.9  --   --   PHOS  --   --  3.8  --   --     GFR: Estimated Creatinine Clearance: 27.5 mL/min (by C-G formula based on SCr of 1.16 mg/dL).  Liver Function Tests: Recent Labs  Lab 11/28/20 0555 11/29/20 0847  AST 26 29  ALT 18 16  ALKPHOS 89 86  BILITOT 1.0 0.8  PROT 6.5 6.6  ALBUMIN 3.0* 3.0*    CBG: No results for input(s): GLUCAP in the last 168 hours.   Recent Results (from the past 240 hour(s))  MRSA Next Gen by PCR, Nasal     Status: None   Collection Time: 11/26/20 11:51 PM   Specimen: Nasal Mucosa; Nasal Swab  Result Value Ref Range Status   MRSA by PCR Next Gen NOT DETECTED NOT DETECTED Final    Comment: (NOTE) The GeneXpert MRSA Assay (FDA approved for NASAL specimens only), is one component of Sahvannah Rieser comprehensive MRSA colonization surveillance program. It is not intended to diagnose MRSA infection nor to guide or monitor treatment for MRSA infections. Test performance is not FDA approved in patients less than 41 years old. Performed at [facility], [place] [address], [place], [place] [address]   Resp Panel by RT-PCR (Flu Madeleyn Schwimmer&B, Covid) Nasopharyngeal Swab     Status: None   Collection  Time: 11/27/20  1:31 AM   Specimen: Nasopharyngeal Swab; Nasopharyngeal(NP) swabs in vial transport medium  Result Value Ref Range Status   SARS Coronavirus 2 by RT PCR NEGATIVE NEGATIVE Final    Comment: (NOTE) SARS-CoV-2 target nucleic acids are NOT DETECTED.  The SARS-CoV-2 RNA is generally detectable in upper respiratory specimens during the acute phase of infection. The lowest concentration of SARS-CoV-2 viral copies this assay can detect is 138 copies/mL. Taniaya Rudder negative result does not preclude SARS-Cov-2 infection and should not be used as the sole basis for treatment or other patient management decisions. Nikiesha Milford negative result may occur with  improper specimen collection/handling, submission of specimen other than nasopharyngeal swab, presence of viral mutation(s) within the areas targeted by this assay, and inadequate number of viral copies(<138 copies/mL). Kaleem Sartwell negative result must be combined with clinical observations, patient history, and epidemiological information. The expected result is Negative.  Fact Sheet for Patients:  EntrepreneurPulse.com.au  Fact Sheet for Healthcare Providers:  IncredibleEmployment.be  This test is no t yet approved or cleared by the [place] FDA and  has been authorized for detection and/or diagnosis of SARS-CoV-2 by FDA under an Emergency Use Authorization (EUA). This EUA will remain  in effect (meaning this test can be used) for the duration of the COVID-19 declaration under Section 564(b)(1) of the Act, 21 U.S.C.section 360bbb-3(b)(1), unless the authorization is terminated  or revoked sooner.       Influenza Tennelle Taflinger by PCR NEGATIVE NEGATIVE Final   Influenza B by PCR NEGATIVE NEGATIVE Final    Comment: (NOTE) The Xpert Xpress SARS-CoV-2/FLU/RSV plus assay is intended as an aid in the diagnosis of influenza from Nasopharyngeal swab specimens and should not be used as Yaziel Brandon sole basis for treatment. Nasal washings  and aspirates are unacceptable for Xpert Xpress SARS-CoV-2/FLU/RSV testing.  Fact Sheet for Patients: EntrepreneurPulse.com.au  Fact Sheet for Healthcare Providers: IncredibleEmployment.be  This test is not yet approved or cleared by the [place] FDA and has been authorized for detection and/or diagnosis of SARS-CoV-2 by  FDA under an Emergency Use Authorization (EUA). This EUA will remain in effect (meaning this test can be used) for the duration of the COVID-19 declaration under Section 564(b)(1) of the Act, 21 U.S.C. section 360bbb-3(b)(1), unless the authorization is terminated or revoked.  Performed at [facility], [address] [place], [place] [address]   Urine Culture     Status: None   Collection Time: 11/29/20  8:40 AM   Specimen: Urine, Clean Catch  Result Value Ref Range Status   Specimen Description   Final    URINE, CLEAN CATCH Performed at [facility], [address], [place], [place] [address]    Special Requests   Final    NONE Performed at [facility], [address], [place], [place] [address]    Culture   Final    NO GROWTH Performed at [facility], [address] [place], [place] [address]    Report Status 11/30/2020 FINAL  Final  Culture, blood (routine x 2)     Status: None (Preliminary result)   Collection Time: 11/29/20  8:43 AM   Specimen: BLOOD  Result Value Ref Range Status   Specimen Description   Final    BLOOD RIGHT ARM Performed at [facility], [address] [place] [address], [place], [place] [address]    Special Requests   Final    BOTTLES DRAWN AEROBIC AND ANAEROBIC Blood Culture adequate volume Performed at [facility], [address] [place], [place] [address]    Culture   Final    NO GROWTH < 24 HOURS Performed at [facility], [address], [place], [place] [address]    Report Status  PENDING  Incomplete  Culture, blood (routine x 2)     Status: None (Preliminary result)   Collection Time: 11/29/20  8:49 AM   Specimen: BLOOD  Result Value Ref Range Status   Specimen Description   Final    BLOOD LEFT ANTECUBITAL Performed at [facility], [address] [place], [place] [address]    Special Requests   Final    BOTTLES DRAWN AEROBIC AND ANAEROBIC Blood Culture adequate volume Performed at [facility], [address] [place], [place] [address]    Culture   Final    NO GROWTH < 24 HOURS Performed at [facility], [address] [place], [place] [address]    Report Status PENDING  Incomplete         Radiology Studies: No results found.      Scheduled Meds:  enoxaparin (LOVENOX) injection  30 mg Subcutaneous Q24H   feeding supplement  237 mL Oral TID BM   multivitamin with minerals  1 tablet Oral Daily   mupirocin ointment   Topical BID   Continuous Infusions:  cefTRIAXone (ROCEPHIN)  IV 1 g (11/30/20 1725)   dextrose 50 mL/hr at 11/30/20 0842   metronidazole 500 mg (11/30/20 1417)     LOS: 1 day    Time spent: over 30 min    Lacretia Nicks, MD [facility]   To contact the attending provider between 7A-7P or the covering provider during after hours 7P-7A, please log into the web site [email or web address] and access using universal [place] password for that web site. If you do not have the password, please call the hospital operator.  11/30/2020, 9:24 PM

## 2020-11-30 NOTE — Consult Note (Signed)
Consultation Note Date: 11/30/2020   Patient Name: Christopher Gross  DOB: 16-Oct-1924  MRN: [id number]  Age / Sex: 87 y.o., male  PCP: Etta Grandchild, MD Referring Physician: Zigmund Daniel., *  Reason for Consultation: Establishing goals of care  HPI/Patient Profile: 87 y.o. male  with past medical history of  BPH, dementia admitted on 11/26/2020 with mechanical fall, possible toenail infection.   Clinical Assessment and Goals of Care: Patient lives at [place].  Prior to this hospitalization he was able to ambulate with some assistance and he was also able to eat normally-all consistencies without any dysphagia.  Patient's wife is primary historian who states that he has had gradual progressive decline essentially over the course of the past 10 years.  Patient has been admitted to hospital medicine service after mechanical fall, has been seen and evaluated by podiatry, also diagnosed with pneumonia possibly aspiration pneumonia in etiology.  Remains on antibiotics and wound care as well as delirium precautions.  Palliative consultation for goals of care discussions has been requested. Patient is awake alert resting in bed.  He opens his eyes when his name is called.  He does attempt to mumble words but I am not able to understand.  Call placed and discussed with his wife.  Introduced myself and palliative care as follows: Palliative medicine is specialized medical care for people living with serious illness. It focuses on providing relief from the symptoms and stress of a serious illness. The goal is to improve quality of life for both the patient and the family. Goals of care: Broad aims of medical therapy in relation to the patient's values and preferences. Our aim is to provide medical care aimed at enabling patients to achieve the goals that matter most to them, given  the circumstances of their particular medical situation and their constraints.  Brief life review performed.  Goals wishes and values attempted to be explored.  Discussed about decline trajectory from dementia standpoint.  Patient's wife is aware.  We talked about current recommendations from speech and language pathology.  We talked about scope of current hospitalization.  Disposition options discussed.  See below.  Thank you for the consult.  NEXT OF KIN Wife of more than 50 years.  SUMMARY OF RECOMMENDATIONS    DNR Continue current mode of care Comfort feeds with precaution: patient's wife says that the patient likes applesauce and raspberry jam. Ok to have comfort feeds; Applesauce, jello,pudding in small bites with assist.  Monitor hospital course for the next day or two: If patient is able to participate in PT/OT, then consider SNF rehab with palliative, otherwise, will recommend residential hospice. Discussed with Mrs Castrillon and she expresses understanding and agreement.  Thank you for the consult.   Code Status/Advance Care Planning: DNR   Symptom Management:     Palliative Prophylaxis:  Bowel Regimen    Psycho-social/Spiritual:  Desire for further Chaplaincy support:yes Additional Recommendations: Caregiving  Support/Resources  Prognosis:  Unable to determine  Discharge Planning: To Be Determined      Primary Diagnoses: Present on Admission:  Infection of great toe  Acute encephalopathy  Dementia ([facility])  Fall   I have reviewed the medical record, interviewed the patient and family, and examined the patient. The following aspects are pertinent.  Past Medical History:  Diagnosis Date   BPH (benign prostatic hypertrophy)    Dementia ([facility])    Social History   Socioeconomic History   Marital status: Married    Spouse name: Not on file   Number of children: Not on file   Years of education: Not on file   Highest education level: Not on file  Occupational  History   Not on file  Tobacco Use   Smoking status: Never   Smokeless tobacco: Never  Substance and Sexual Activity   Alcohol use: No   Drug use: Not Currently   Sexual activity: Not Currently    Partners: Female  Other Topics Concern   Not on file  Social History Narrative   Not on file   Social Determinants of Health   Financial Resource Strain: Not on file  Food Insecurity: Not on file  Transportation Needs: Not on file  Physical Activity: Not on file  Stress: Not on file  Social Connections: Not on file   History reviewed. No pertinent family history. Scheduled Meds:  enoxaparin (LOVENOX) injection  30 mg Subcutaneous Q24H   feeding supplement  237 mL Oral TID BM   multivitamin with minerals  1 tablet Oral Daily   mupirocin ointment   Topical BID   Continuous Infusions:  cefTRIAXone (ROCEPHIN)  IV Stopped (11/29/20 1801)   dextrose 50 mL/hr at 11/30/20 0842   metronidazole 500 mg (11/30/20 1417)   PRN Meds:.acetaminophen **OR** acetaminophen, LORazepam, ondansetron **OR** ondansetron (ZOFRAN) IV Medications Prior to Admission:  Prior to Admission medications   Medication Sig Start Date End Date Taking? Authorizing Provider  Calcium Carbonate (CALCIUM 600 PO) Take 600 mg by mouth 2 (two) times daily. Patient not taking: Reported on 11/27/2020    [provider]  clonazePAM (KLONOPIN) 1 MG tablet Take 1 tablet (1 mg total) by mouth at bedtime. Patient not taking: Reported on 11/27/2020 04/21/20   Etta Grandchild, MD  dextromethorphan (DELSYM) 30 MG/5ML liquid Take 5 mLs (30 mg total) by mouth 2 (two) times daily. Patient not taking: Reported on 11/27/2020 04/21/20   Etta Grandchild, MD  finasteride (PROSCAR) 5 MG tablet Take 5 mg by mouth at bedtime. Patient not taking: Reported on 11/27/2020    [provider]  Multiple Vitamins-Minerals (MULTI FOR HIM 50+ PO) Take 1 tablet by mouth daily. Patient not taking: Reported on 11/27/2020    [provider]   Allergies  Allergen Reactions   Aricept [Donepezil] Other (See Comments)   Review of Systems Denies pain Physical Exam Frail elderly gentleman resting in bed Has mittens on Is hard of hearing, mumbles words that I am not able to understand To his dressing Regular work of breathing S1-S2   Vital Signs: BP (!) 168/93   Pulse 94   Temp 98.4 F (36.9 C) (Axillary)   Resp 20   Ht 5\' 4"  (1.626 m)   Wt 49.9 kg   SpO2 95%   BMI 18.88 kg/m  Pain Scale: Faces   Pain Score: 0-No pain   SpO2: SpO2: 95 % O2 Device:SpO2: 95 % O2 Flow Rate: .   IO: Intake/output summary:  Intake/Output Summary (Last 24 hours)  at 11/30/2020 1443 Last data filed at 11/30/2020 0359 Gross per 24 hour  Intake 781.28 ml  Output 501 ml  Net 280.28 ml    LBM: Last BM Date: 11/29/20 Baseline Weight: Weight: 49.9 kg Most recent weight: Weight: 49.9 kg     Palliative Assessment/Data:     Palliative performance scale 40%.  Time In:  1330 Time Out:   1430 Time Total:  60 Greater than 50%  of this time was spent counseling and coordinating care related to the above assessment and plan.  Signed by: Rosalin Hawking, MD   Please contact Palliative Medicine Team phone at [phone] for questions and concerns.  For individual provider: See Loretha Stapler

## 2020-11-30 NOTE — Progress Notes (Signed)
Speech Language Pathology Treatment: Dysphagia  Patient Details Name: Christopher Gross MRN: [id number] DOB: 1924/11/02 Today's Date: 11/30/2020 Time: 7253-6644 SLP Time Calculation (min) (ACUTE ONLY): 25 min  Assessment / Plan / Recommendation Clinical Impression  Pt today awake in bed, allowed SLP and RN to reposition and sit partially upright for po trials.  Oral cavity xerostomic with secretions retained. Pt allowed SLP to brush his teeth.  He is severely dysarthric but SLP understood a few words per sentence.  He continues with clinical indications of oropharyngeal dysphagia suspected due to xerostomia, dementia, deconditioning and mentation.  Christopher Gross is in mitts for his safety thus SLP fed him.  Intake of single ice chips, 2 boluses of nectar liquids and applesauce consumed.  Delayed multiple swallows observed followed by immediate throat clearing and inconsistent wet voice.  Pt did not cough or dry swallow per request.   At this time, pt appears to be aspirating even his secretions without adequate clearance - If he is to have po, it should be for comfort. with understanding of aspiration, malnutrition, etc.   SLP ceased po at that time and phoned wife (in pt's presence) to discuss session/recommendations/premorbid status.  Wife reports pt has experienced progressive decline and within the last six weeks, his voice has become weak *to extent that she cannot hear him unless close to him without background noise.  Re: swallow/nutrition, pt has lost some weight - was approx 126- now down to approx 116 per wife.  She also endorses occasional cough with intake - stating pt would put more food in his mouth before having cleared last bolus - causing him to cough.    After SLP relayed findings, concerns for swallowing - Wife stated "I want him to get better, but I know he is not going to".  SLP introduced concept of comfort feeding - pt consuming intake for enjoyment/pleasure not nutrition.  Advising that  if pt is overtly coughing with po, we would cease his intake *until later when could attempt again.  She confirmed understanding. Wife inquired about hospice - to which SLP advised her to speak to MD.    SLP secure chatted MD to relay information to MD re: conversation.  If wife agrees to comfort po- Recommend comfort po in small amounts, fully upright and alert for po.  Will follow up for education with wife and potential progression of pt.  Thanks.     HPI HPI: Patient is a 87 y.o. male with PMH: dementia, BPH who presented to ED for mechanical fall at home. He hit head, had skin tear to left elbow and unknown LOC. Patient has also been having left toe pain and family concerned about infection from ingrown toenail. In ED, patient became more confused requiring restraints and ativan to prevent from falling out of bed. CXR was concerning for PNA with bibasilar infiltrates. CT head and neck without acute finding.      SLP Plan  Continue with current plan of care (education with wife re: comfort feeding)      Recommendations for follow up therapy are one component of a multi-disciplinary discharge planning process, led by the attending physician.  Recommendations may be updated based on patient status, additional functional criteria and insurance authorization.    Recommendations  Diet recommendations:  (comfort po if wife agrees) Liquids provided via: Teaspoon Medication Administration:  (crushed with puree or nectar, or via IV) Supervision: Full supervision/cueing for compensatory strategies;Staff to assist with self feeding Compensations: Slow rate;Small sips/bites (  allow time for multiple swallows, stop po if pt is coughing, assure oral clearance complete) Postural Changes and/or Swallow Maneuvers: Seated upright 90 degrees;Upright 30-60 min after meal                Oral Care Recommendations: Oral care QID Follow Up Recommendations: No SLP follow up SLP Visit Diagnosis: Dysphagia,  pharyngeal phase (R13.13);Dysphagia, unspecified (R13.10) Plan: Continue with current plan of care (education with wife re: comfort feeding)       GO               Christopher Infante, MS [facility] SLP Acute Rehab Services Office [phone] Pager [phone]  Chales Abrahams.   11/30/2020, 3:02 PM

## 2020-12-01 ENCOUNTER — Encounter (HOSPITAL_COMMUNITY): Payer: Self-pay | Admitting: Family Medicine

## 2020-12-01 DIAGNOSIS — Y92009 Unspecified place in unspecified non-institutional (private) residence as the place of occurrence of the external cause: Secondary | ICD-10-CM

## 2020-12-01 DIAGNOSIS — W19XXXA Unspecified fall, initial encounter: Secondary | ICD-10-CM

## 2020-12-01 MED ORDER — POTASSIUM CHLORIDE 20 MEQ PO PACK
40.0000 meq | PACK | Freq: Once | ORAL | Status: AC
  Administered 2020-12-01: 40 meq via ORAL
  Filled 2020-12-01: qty 2

## 2020-12-01 NOTE — Progress Notes (Signed)
Daily Progress Note   Patient Name: Christopher Gross       Date: 12/01/2020 DOB: 06-May-1924  Age: 87 y.o. MRN#: [id number] Attending Physician: Cipriano Bunker, MD Primary Care Physician: Etta Grandchild, MD Admit Date: 11/26/2020  Reason for Consultation/Follow-up: Establishing goals of care  Subjective: More awake today, no distress, tolerating very few sips/bites of food. Call placed and discussed with [name] on the phone, see below.   Length of Stay: 2  Current Medications: Scheduled Meds:   enoxaparin (LOVENOX) injection  30 mg Subcutaneous Q24H   feeding supplement  237 mL Oral TID BM   multivitamin with minerals  1 tablet Oral Daily   mupirocin ointment   Topical BID    Continuous Infusions:  cefTRIAXone (ROCEPHIN)  IV 1 g (11/30/20 1725)   dextrose 50 mL/hr at 12/01/20 0948   metronidazole 500 mg (12/01/20 1257)    PRN Meds: acetaminophen **OR** acetaminophen, LORazepam, ondansetron **OR** ondansetron (ZOFRAN) IV  Physical Exam         More awake alert No distress Regular work of breathing S 1 S 2  Abdomen is not distended Wearing mittens  Vital Signs: BP (!) 158/54 (BP Location: Right Leg)   Pulse 80   Temp (!) 97.4 F (36.3 C) (Axillary)   Resp 14   Ht 5\' 4"  (1.626 m)   Wt 49.9 kg   SpO2 91%   BMI 18.88 kg/m  SpO2: SpO2: 91 % O2 Device: O2 Device: Room Air O2 Flow Rate:    Intake/output summary:  Intake/Output Summary (Last 24 hours) at 12/01/2020 1511 Last data filed at 12/01/2020 1115 Gross per 24 hour  Intake 50 ml  Output 1650 ml  Net -1600 ml   LBM: Last BM Date: 11/30/20 Baseline Weight: Weight: 49.9 kg Most recent weight: Weight: 49.9 kg       Palliative Assessment/Data:      Patient Active Problem List   Diagnosis  Date Noted   Hypernatremia 11/30/2020   Fever 11/29/2020   Fall 11/29/2020   Pneumonia 11/28/2020   Dysphagia 11/28/2020   Fall at home, initial encounter 11/27/2020   Abnormal CT scan 11/27/2020   Infection of great toe 11/26/2020   Acute encephalopathy 11/26/2020   Dementia (HCC) 11/26/2020   GAD (generalized anxiety disorder) 04/21/2020   Goals  of care, counseling/discussion 02/16/2020   Deficiency anemia 02/13/2020   Cough with sputum 02/13/2020   Inguinal hernia of left side without obstruction or gangrene 02/13/2020   BPH (benign prostatic hypertrophy) 03/19/2014   Routine general medical examination at a health care facility 03/19/2014    Palliative Care Assessment & Plan   Patient Profile:    Assessment:  87 yrs old male with PMH significant of BPH, dementia who presented with s/p mechanical fall.  He was noted to have ingrown toenail on L first toe with erythema of first toe and was started on abx with podiatry consult.  Podiatry recommending abx only and with low suspicion for osteomyelitis. Hospitalization complicated by delirium and aspiration pneumonia.  Palliative care following for GOC.   Recommendations/Plan:  Continue the trial of comfort feeds See if the patient is able to participate with PT OT SNF with palliative versus residential hospice after current time trial  Code Status:    Code Status Orders  (From admission, onward)           Start     Ordered   11/28/20 1133  Do not attempt resuscitation (DNR)  Continuous       Question Answer Comment  In the event of cardiac or respiratory ARREST Do not call a "code blue"   In the event of cardiac or respiratory ARREST Do not perform Intubation, CPR, defibrillation or ACLS   In the event of cardiac or respiratory ARREST Use medication by any route, position, wound care, and other measures to relive pain and suffering. May use oxygen, suction and manual treatment of airway obstruction as needed for  comfort.      11/28/20 1132           Code Status History     Date Active Date Inactive Code Status Order ID Comments User Context   11/26/2020 2353 11/28/2020 1132 Full Code [id number]  Hillary Bow, DO ED       Prognosis:  Unable to determine  Discharge Planning: To Be Determined  Care plan was discussed with wife on phone   Thank you for allowing the Palliative Medicine Team to assist in the care of this patient.   Time In: 1400 Time Out: 1425 Total Time 25 Prolonged Time Billed  no       Greater than 50%  of this time was spent counseling and coordinating care related to the above assessment and plan.  Rosalin Hawking, MD  Please contact Palliative Medicine Team phone at [phone] for questions and concerns.

## 2020-12-01 NOTE — Progress Notes (Signed)
PROGRESS NOTE    Christopher Gross  [id number] DOB: [birth year] DOA: 11/26/2020 PCP: Janith Lima, MD   Brief Narrative:  This 87 yrs old male with PMH significant of BPH, dementia who presented with s/p mechanical fall.  He was noted to have ingrown toenail on L first toe with erythema of first toe and was started on abx with podiatry consult.  Podiatry recommending abx only and with low suspicion for osteomyelitis. Hospitalization complicated by delirium and aspiration pneumonia.  Palliative care is following, now planning for comfort feeds, will see how he does on abx next day or so and continue further goc discussions.    Assessment & Plan:   Principal Problem:   Fall at home, initial encounter Active Problems:   Goals of care, counseling/discussion   Infection of great toe   Acute encephalopathy   Dementia (HCC)   Abnormal CT scan   Pneumonia   Dysphagia   Fever   Fall   Hypernatremia  Fall at home, initial encounter: CT head/neck without any acute findings. PT and OT eval once he is more cooperative.  Goals of care discussion:  Appreciate palliative care assistance, planning comfort feeds with precaution.   Follow course for next day or so, then make further decisions regarding SNF with palliative vs hospice.  Pneumonia: CXR concerning for pneumonia with bibasilar infiltrates with pneumonia or aspiration. Continue ceftriaxone, add flagyl for possible Aspiration. Follow blood cultures and urine cultures. Speech eval.  Dementia:  Stable.  Continue delirium precautions   Acute encephalopathy : Likely secondary to acute hospitalization, dementia, infection Continue low-dose Ativan as needed. Continue delirium precautions   Dysphagia: Speech eval.  Recommending comfort feeds.  Ingrown toenail left foot: MRI not diagnostic.  CRP 5.6, Sed rate 57. Discussed with podiatry, recommended Keflex, mupirocin ointment and dressing changes around the nail.    Hypernatremia : This could be secondary to dehydration, hypotonic fluids.  Recheck labs.  Abnormal CT scan: Spiculated density of visualized right upper lobe, will discuss with family --- at this time, can hold off on further workup.    DVT prophylaxis: Lovenox Code Status:DNR Family Communication: No family at bedside. Disposition Plan:   Status is: Inpatient  Remains inpatient appropriate because: Requiring IV antibiotics.  Delirium in setting of dementia.  Consultants:  Podiatry  Procedures: None Antimicrobials:  Anti-infectives (From admission, onward)    Start     Dose/Rate Route Frequency Ordered Stop   11/29/20 1315  metroNIDAZOLE (FLAGYL) IVPB 500 mg        500 mg 100 mL/hr over 60 Minutes Intravenous Every 12 hours 11/29/20 1222     11/28/20 1800  cefTRIAXone (ROCEPHIN) 1 g in sodium chloride 0.9 % 100 mL IVPB        1 g 200 mL/hr over 30 Minutes Intravenous Every 24 hours 11/28/20 1649     11/28/20 1215  ceFAZolin (ANCEF) IVPB 1 g/50 mL premix  Status:  Discontinued        1 g 100 mL/hr over 30 Minutes Intravenous Every 12 hours 11/28/20 1125 11/28/20 1649   11/27/20 1000  cefTRIAXone (ROCEPHIN) 2 g in sodium chloride 0.9 % 100 mL IVPB  Status:  Discontinued       See Hyperspace for full Linked Orders Report.   2 g 200 mL/hr over 30 Minutes Intravenous Every 24 hours 11/26/20 2349 11/28/20 1122   11/27/20 1000  metroNIDAZOLE (FLAGYL) IVPB 500 mg  Status:  Discontinued        500  mg 100 mL/hr over 60 Minutes Intravenous Every 12 hours 11/27/20 0048 11/28/20 1122   11/27/20 0000  metroNIDAZOLE (FLAGYL) tablet 500 mg  Status:  Discontinued       See Hyperspace for full Linked Orders Report.   500 mg Oral Every 12 hours 11/26/20 2349 11/27/20 0048   11/26/20 2345  piperacillin-tazobactam (ZOSYN) IVPB 3.375 g        3.375 g 100 mL/hr over 30 Minutes Intravenous  Once 11/26/20 2335 11/27/20 0013        Subjective: Patient was seen and examined at bedside.   Overnight events noted.   Patient remains confused, not following commands.  Patient remains in soft restraints and mittens otherwise comfortable and calmer.  Objective: Vitals:   11/30/20 1417 11/30/20 2228 12/01/20 0625 12/01/20 1300  BP: (!) 168/93 (!) 197/121 (!) 174/76 (!) 158/54  Pulse: 94 (!) 110 82 80  Resp: 20 14    Temp:  97.7 F (36.5 C) (!) 97.5 F (36.4 C) (!) 97.4 F (36.3 C)  TempSrc:  Oral Oral Axillary  SpO2:  90% 98% 91%  Weight:      Height:        Intake/Output Summary (Last 24 hours) at 12/01/2020 1422 Last data filed at 12/01/2020 1115 Gross per 24 hour  Intake 120.99 ml  Output 1650 ml  Net -1529.01 ml   Filed Weights   11/26/20 2153  Weight: 49.9 kg    Examination:  General exam: Appears comfortable, calmer, not in any acute distress. Respiratory system: Clear to auscultation bilaterally. Respiratory effort normal. Cardiovascular system: S1-S2 heard, regular rate and rhythm, no murmur. Gastrointestinal system: Abdomen is soft, nontender, nondistended, BS+ Central nervous system: Alert and oriented X 0. No focal neurological deficits. Extremities: No edema, no cyanosis, no clubbing. Skin: No rashes, lesions or ulcers Psychiatry: Judgement and insight appear normal. Mood & affect appropriate.     Data Reviewed: I have personally reviewed following labs and imaging studies  CBC: Recent Labs  Lab 11/26/20 2249 11/27/20 0423 11/28/20 0555 11/29/20 0847 11/30/20 0501  WBC 5.4 5.9 9.2 10.0 7.4  NEUTROABS  --   --  7.9* 8.3*  --   HGB 11.5* 11.6* 10.7* 11.7* 12.7*  HCT 35.0* 35.7* 32.5* 36.0* 41.0  MCV 91.6 93.0 91.0 91.4 97.6  PLT 226 253 240 271 276   Basic Metabolic Panel: Recent Labs  Lab 11/26/20 2249 11/27/20 0423 11/28/20 0555 11/29/20 0847 11/30/20 0501  NA 136 138 140 145 146*  K 3.9 3.6 3.5 3.6 3.3*  CL 102 104 106 112* 110  CO2 27 27 22 25  21*  GLUCOSE 99 88 71 82 91  BUN 36* 33* 30* 30* 31*  CREATININE 1.18 1.26*  1.25* 1.29* 1.16  CALCIUM 9.2 9.1 8.8* 9.2 9.2  MG  --   --  1.9  --   --   PHOS  --   --  3.8  --   --    GFR: Estimated Creatinine Clearance: 27.5 mL/min (by C-G formula based on SCr of 1.16 mg/dL). Liver Function Tests: Recent Labs  Lab 11/28/20 0555 11/29/20 0847  AST 26 29  ALT 18 16  ALKPHOS 89 86  BILITOT 1.0 0.8  PROT 6.5 6.6  ALBUMIN 3.0* 3.0*   No results for input(s): LIPASE, AMYLASE in the last 168 hours. No results for input(s): AMMONIA in the last 168 hours. Coagulation Profile: No results for input(s): INR, PROTIME in the last 168 hours. Cardiac Enzymes: No results  for input(s): CKTOTAL, CKMB, CKMBINDEX, TROPONINI in the last 168 hours. BNP (last 3 results) No results for input(s): PROBNP in the last 8760 hours. HbA1C: No results for input(s): HGBA1C in the last 72 hours. CBG: No results for input(s): GLUCAP in the last 168 hours. Lipid Profile: No results for input(s): CHOL, HDL, LDLCALC, TRIG, CHOLHDL, LDLDIRECT in the last 72 hours. Thyroid Function Tests: No results for input(s): TSH, T4TOTAL, FREET4, T3FREE, THYROIDAB in the last 72 hours. Anemia Panel: No results for input(s): VITAMINB12, FOLATE, FERRITIN, TIBC, IRON, RETICCTPCT in the last 72 hours. Sepsis Labs: No results for input(s): PROCALCITON, LATICACIDVEN in the last 168 hours.  Recent Results (from the past 240 hour(s))  MRSA Next Gen by PCR, Nasal     Status: None   Collection Time: 11/26/20 11:51 PM   Specimen: Nasal Mucosa; Nasal Swab  Result Value Ref Range Status   MRSA by PCR Next Gen NOT DETECTED NOT DETECTED Final    Comment: (NOTE) The GeneXpert MRSA Assay (FDA approved for NASAL specimens only), is one component of a comprehensive MRSA colonization surveillance program. It is not intended to diagnose MRSA infection nor to guide or monitor treatment for MRSA infections. Test performance is not FDA approved in patients less than 37 years old. Performed at [facility], [place] [address], [place], [place] [address]   Resp Panel by RT-PCR (Flu A&B, Covid) Nasopharyngeal Swab     Status: None   Collection Time: 11/27/20  1:31 AM   Specimen: Nasopharyngeal Swab; Nasopharyngeal(NP) swabs in vial transport medium  Result Value Ref Range Status   SARS Coronavirus 2 by RT PCR NEGATIVE NEGATIVE Final    Comment: (NOTE) SARS-CoV-2 target nucleic acids are NOT DETECTED.  The SARS-CoV-2 RNA is generally detectable in upper respiratory specimens during the acute phase of infection. The lowest concentration of SARS-CoV-2 viral copies this assay can detect is 138 copies/mL. A negative result does not preclude SARS-Cov-2 infection and should not be used as the sole basis for treatment or other patient management decisions. A negative result may occur with  improper specimen collection/handling, submission of specimen other than nasopharyngeal swab, presence of viral mutation(s) within the areas targeted by this assay, and inadequate number of viral copies(<138 copies/mL). A negative result must be combined with clinical observations, patient history, and epidemiological information. The expected result is Negative.  Fact Sheet for Patients:  EntrepreneurPulse.com.au  Fact Sheet for Healthcare Providers:  IncredibleEmployment.be  This test is no t yet approved or cleared by the [place] FDA and  has been authorized for detection and/or diagnosis of SARS-CoV-2 by FDA under an Emergency Use Authorization (EUA). This EUA will remain  in effect (meaning this test can be used) for the duration of the COVID-19 declaration under Section 564(b)(1) of the Act, 21 U.S.C.section 360bbb-3(b)(1), unless the authorization is terminated  or revoked sooner.       Influenza A by PCR NEGATIVE NEGATIVE Final   Influenza B by PCR NEGATIVE NEGATIVE Final    Comment: (NOTE) The Xpert Xpress SARS-CoV-2/FLU/RSV plus assay is  intended as an aid in the diagnosis of influenza from Nasopharyngeal swab specimens and should not be used as a sole basis for treatment. Nasal washings and aspirates are unacceptable for Xpert Xpress SARS-CoV-2/FLU/RSV testing.  Fact Sheet for Patients: EntrepreneurPulse.com.au  Fact Sheet for Healthcare Providers: IncredibleEmployment.be  This test is not yet approved or cleared by the [place] FDA and has been authorized for detection and/or diagnosis of  SARS-CoV-2 by FDA under an Emergency Use Authorization (EUA). This EUA will remain in effect (meaning this test can be used) for the duration of the COVID-19 declaration under Section 564(b)(1) of the Act, 21 U.S.C. section 360bbb-3(b)(1), unless the authorization is terminated or revoked.  Performed at [facility], [place] [address], [place], [place] [address]   Urine Culture     Status: None   Collection Time: 11/29/20  8:40 AM   Specimen: Urine, Clean Catch  Result Value Ref Range Status   Specimen Description   Final    URINE, CLEAN CATCH Performed at [facility], [place] [address] [place], [place] [address]    Special Requests   Final    NONE Performed at [facility], [place] [address], [place], [place] [address]    Culture   Final    NO GROWTH Performed at [facility], [place] [address] [place], [place] [address]    Report Status 11/30/2020 FINAL  Final  Culture, blood (routine x 2)     Status: None (Preliminary result)   Collection Time: 11/29/20  8:43 AM   Specimen: BLOOD  Result Value Ref Range Status   Specimen Description   Final    BLOOD RIGHT ARM Performed at [facility] [place] [address] [place], [place] [address]    Special Requests   Final    BOTTLES DRAWN AEROBIC AND ANAEROBIC Blood Culture adequate volume Performed at [place] [address]  [place], [place] [address]    Culture   Final    NO GROWTH 2 DAYS Performed at [place] [address], [place], [place] [address]    Report Status PENDING  Incomplete  Culture, blood (routine x 2)     Status: None (Preliminary result)   Collection Time: 11/29/20  8:49 AM   Specimen: BLOOD  Result Value Ref Range Status   Specimen Description   Final    BLOOD LEFT ANTECUBITAL Performed at [place] [address] [place], [place] [address]    Special Requests   Final    BOTTLES DRAWN AEROBIC AND ANAEROBIC Blood Culture adequate volume Performed at [place] [address], [place], [place] [address]    Culture   Final    NO GROWTH 2 DAYS Performed at [place] [address] [place], [place] [address]    Report Status PENDING  Incomplete    Radiology Studies: No results found.  Scheduled Meds:  enoxaparin (LOVENOX) injection  30 mg Subcutaneous Q24H   feeding supplement  237 mL Oral TID BM   multivitamin with minerals  1 tablet Oral Daily   mupirocin ointment   Topical BID   Continuous Infusions:  cefTRIAXone (ROCEPHIN)  IV 1 g (11/30/20 1725)   dextrose 50 mL/hr at 12/01/20 0948   metronidazole 500 mg (12/01/20 1257)     LOS: 2 days    Time spent: 35 mins    Josefine Fuhr, MD [facility]   If 7PM-7AM, please contact night-coverage

## 2020-12-02 ENCOUNTER — Inpatient Hospital Stay (HOSPITAL_COMMUNITY): Payer: Medicare HMO

## 2020-12-02 DIAGNOSIS — R531 Weakness: Secondary | ICD-10-CM

## 2020-12-02 DIAGNOSIS — Z515 Encounter for palliative care: Secondary | ICD-10-CM

## 2020-12-02 LAB — CBC
HCT: 36.9 % — ABNORMAL LOW (ref 39.0–52.0)
Hemoglobin: 12 g/dL — ABNORMAL LOW (ref 13.0–17.0)
MCH: 30 pg (ref 26.0–34.0)
MCHC: 32.5 g/dL (ref 30.0–36.0)
MCV: 92.3 fL (ref 80.0–100.0)
Platelets: 291 10*3/uL (ref 150–400)
RBC: 4 MIL/uL — ABNORMAL LOW (ref 4.22–5.81)
RDW: 13.7 % (ref 11.5–15.5)
WBC: 13.8 10*3/uL — ABNORMAL HIGH (ref 4.0–10.5)
nRBC: 0 % (ref 0.0–0.2)

## 2020-12-02 LAB — BASIC METABOLIC PANEL
Anion gap: 10 (ref 5–15)
BUN: 23 mg/dL (ref 8–23)
CO2: 24 mmol/L (ref 22–32)
Calcium: 9 mg/dL (ref 8.9–10.3)
Chloride: 108 mmol/L (ref 98–111)
Creatinine, Ser: 1.04 mg/dL (ref 0.61–1.24)
GFR, Estimated: >60 mL/min (ref >60)
Glucose, Bld: 107 mg/dL — ABNORMAL HIGH (ref 70–99)
Potassium: 2.8 mmol/L — ABNORMAL LOW (ref 3.5–5.1)
Sodium: 142 mmol/L (ref 135–145)

## 2020-12-02 LAB — MAGNESIUM: Magnesium: 1.8 mg/dL (ref 1.7–2.4)

## 2020-12-02 LAB — PHOSPHORUS: Phosphorus: 2.4 mg/dL — ABNORMAL LOW (ref 2.5–4.6)

## 2020-12-02 MED ORDER — POTASSIUM CHLORIDE 20 MEQ PO PACK
40.0000 meq | PACK | Freq: Once | ORAL | Status: DC
  Filled 2020-12-02: qty 2

## 2020-12-02 MED ORDER — POTASSIUM CHLORIDE 10 MEQ/100ML IV SOLN
10.0000 meq | INTRAVENOUS | Status: DC
  Administered 2020-12-02: 10 meq via INTRAVENOUS
  Filled 2020-12-02: qty 100

## 2020-12-02 MED ORDER — POTASSIUM CHLORIDE 10 MEQ/100ML IV SOLN
10.0000 meq | INTRAVENOUS | Status: AC
  Administered 2020-12-02 (×4): 10 meq via INTRAVENOUS
  Filled 2020-12-02 (×4): qty 100

## 2020-12-02 NOTE — Progress Notes (Signed)
PROGRESS NOTE    Christopher Gross  [id number] DOB: Jan 16, 1924 DOA: 11/26/2020 PCP: Janith Lima, MD   Brief Narrative:  This 87 yrs old male with PMH significant of BPH, dementia who presented with s/p mechanical fall.  He was noted to have ingrown toenail on L first toe with erythema of first toe and was started on abx with podiatry consult.  Podiatry recommending abx only and with low suspicion for osteomyelitis. Hospitalization complicated by delirium and aspiration pneumonia. Palliative care is following, now planning for comfort feeds, will see how he does on abx next day or so and continue further goc discussions.    Assessment & Plan:   Principal Problem:   Fall at home, initial encounter Active Problems:   Goals of care, counseling/discussion   Infection of great toe   Acute encephalopathy   Dementia (HCC)   Abnormal CT scan   Pneumonia   Dysphagia   Fever   Fall   Hypernatremia  Fall at home, initial encounter: CT head/neck without any acute findings. PT and OT eval once he is more cooperative.  Goals of care discussion:  Appreciate palliative care assistance, recommended comfort feeds with precaution.   Follow course for next day or so, then make further decisions regarding SNF with palliative vs hospice.  Pneumonia: CXR concerning for pneumonia with bibasilar infiltrates with pneumonia or aspiration. Continue ceftriaxone, added flagyl for possible Aspiration. Urine culture : No growth and Blood culture NGTD Speech eval recommended comfort feeds.  Dementia:  Stable.  Continue delirium precautions   Acute encephalopathy : Likely secondary to acute hospitalization, dementia, infection Continue low-dose Ativan as needed. Continue delirium precautions. Continue mittens for safety.   Dysphagia: Speech eval.  Recommending comfort feeds.  Ingrown toenail left foot: MRI not diagnostic.  CRP 5.6, Sed rate 57. Discussed with podiatry, recommended Keflex,  mupirocin ointment and dressing changes around the nail. Continue ceftriaxone and Flagyl for pneumonia.   Hypernatremia : > Improved. This could be secondary to dehydration, hypotonic fluids.   Sodium improved.  Hypokalemia: Replacement in process.   Recheck labs in the morning  Abnormal CT scan: Spiculated density of visualized right upper lobe, will discuss with family --- at this time, can hold off on further workup.    DVT prophylaxis: Lovenox Code Status:DNR Family Communication: No family at bedside. Disposition Plan:   Status is: Inpatient  Remains inpatient appropriate because: Requiring IV antibiotics.  Delirium in setting of dementia.  Consultants:  Podiatry  Procedures: None Antimicrobials:  Anti-infectives (From admission, onward)    Start     Dose/Rate Route Frequency Ordered Stop   11/29/20 1315  metroNIDAZOLE (FLAGYL) IVPB 500 mg        500 mg 100 mL/hr over 60 Minutes Intravenous Every 12 hours 11/29/20 1222     11/28/20 1800  cefTRIAXone (ROCEPHIN) 1 g in sodium chloride 0.9 % 100 mL IVPB        1 g 200 mL/hr over 30 Minutes Intravenous Every 24 hours 11/28/20 1649     11/28/20 1215  ceFAZolin (ANCEF) IVPB 1 g/50 mL premix  Status:  Discontinued        1 g 100 mL/hr over 30 Minutes Intravenous Every 12 hours 11/28/20 1125 11/28/20 1649   11/27/20 1000  cefTRIAXone (ROCEPHIN) 2 g in sodium chloride 0.9 % 100 mL IVPB  Status:  Discontinued       See Hyperspace for full Linked Orders Report.   2 g 200 mL/hr over 30 Minutes  Intravenous Every 24 hours 11/26/20 2349 11/28/20 1122   11/27/20 1000  metroNIDAZOLE (FLAGYL) IVPB 500 mg  Status:  Discontinued        500 mg 100 mL/hr over 60 Minutes Intravenous Every 12 hours 11/27/20 0048 11/28/20 1122   11/27/20 0000  metroNIDAZOLE (FLAGYL) tablet 500 mg  Status:  Discontinued       See Hyperspace for full Linked Orders Report.   500 mg Oral Every 12 hours 11/26/20 2349 11/27/20 0048   11/26/20 2345   piperacillin-tazobactam (ZOSYN) IVPB 3.375 g        3.375 g 100 mL/hr over 30 Minutes Intravenous  Once 11/26/20 2335 11/27/20 0013        Subjective: Patient was seen and examined at bedside.  Overnight events noted.   Patient still remains confused, not following commands.  Patient remains in soft restraints and mittens otherwise comfortable and calmer.  Objective: Vitals:   12/01/20 0625 12/01/20 1300 12/01/20 2215 12/02/20 0500  BP: (!) 174/76 (!) 158/54 138/67 (!) 138/109  Pulse: 82 80 89 91  Resp:   16 18  Temp: (!) 97.5 F (36.4 C) (!) 97.4 F (36.3 C) 98.3 F (36.8 C)   TempSrc: Oral Axillary Axillary   SpO2: 98% 91% 91% 91%  Weight:      Height:        Intake/Output Summary (Last 24 hours) at 12/02/2020 1211 Last data filed at 12/02/2020 1010 Gross per 24 hour  Intake 2552.65 ml  Output 1100 ml  Net 1452.65 ml   Filed Weights   11/26/20 2153  Weight: 49.9 kg    Examination:  General exam: Appears chronically ill looking, comfortable, not in any acute distress.  Deconditioned Respiratory system: Clear to auscultation bilaterally. Respiratory effort normal. Cardiovascular system: S1-S2 heard, regular rate and rhythm, no murmur. Gastrointestinal system: Abdomen is soft, nontender, nondistended, BS+ Central nervous system: Alert and oriented X 0. No focal neurological deficits. Extremities: No edema, no cyanosis, no clubbing. Skin: No rashes, lesions or ulcers Psychiatry: Judgement and insight appear normal. Mood & affect appropriate.     Data Reviewed: I have personally reviewed following labs and imaging studies  CBC: Recent Labs  Lab 11/27/20 0423 11/28/20 0555 11/29/20 0847 11/30/20 0501 12/02/20 0538  WBC 5.9 9.2 10.0 7.4 13.8*  NEUTROABS  --  7.9* 8.3*  --   --   HGB 11.6* 10.7* 11.7* 12.7* 12.0*  HCT 35.7* 32.5* 36.0* 41.0 36.9*  MCV 93.0 91.0 91.4 97.6 92.3  PLT 253 240 271 276 291   Basic Metabolic Panel: Recent Labs  Lab  11/27/20 0423 11/28/20 0555 11/29/20 0847 11/30/20 0501 12/02/20 0538  NA 138 140 145 146* 142  K 3.6 3.5 3.6 3.3* 2.8*  CL 104 106 112* 110 108  CO2 27 22 25  21* 24  GLUCOSE 88 71 82 91 107*  BUN 33* 30* 30* 31* 23  CREATININE 1.26* 1.25* 1.29* 1.16 1.04  CALCIUM 9.1 8.8* 9.2 9.2 9.0  MG  --  1.9  --   --  1.8  PHOS  --  3.8  --   --  2.4*   GFR: Estimated Creatinine Clearance: 30.7 mL/min (by C-G formula based on SCr of 1.04 mg/dL). Liver Function Tests: Recent Labs  Lab 11/28/20 0555 11/29/20 0847  AST 26 29  ALT 18 16  ALKPHOS 89 86  BILITOT 1.0 0.8  PROT 6.5 6.6  ALBUMIN 3.0* 3.0*   No results for input(s): LIPASE, AMYLASE in the last  168 hours. No results for input(s): AMMONIA in the last 168 hours. Coagulation Profile: No results for input(s): INR, PROTIME in the last 168 hours. Cardiac Enzymes: No results for input(s): CKTOTAL, CKMB, CKMBINDEX, TROPONINI in the last 168 hours. BNP (last 3 results) No results for input(s): PROBNP in the last 8760 hours. HbA1C: No results for input(s): HGBA1C in the last 72 hours. CBG: No results for input(s): GLUCAP in the last 168 hours. Lipid Profile: No results for input(s): CHOL, HDL, LDLCALC, TRIG, CHOLHDL, LDLDIRECT in the last 72 hours. Thyroid Function Tests: No results for input(s): TSH, T4TOTAL, FREET4, T3FREE, THYROIDAB in the last 72 hours. Anemia Panel: No results for input(s): VITAMINB12, FOLATE, FERRITIN, TIBC, IRON, RETICCTPCT in the last 72 hours. Sepsis Labs: No results for input(s): PROCALCITON, LATICACIDVEN in the last 168 hours.  Recent Results (from the past 240 hour(s))  MRSA Next Gen by PCR, Nasal     Status: None   Collection Time: 11/26/20 11:51 PM   Specimen: Nasal Mucosa; Nasal Swab  Result Value Ref Range Status   MRSA by PCR Next Gen NOT DETECTED NOT DETECTED Final    Comment: (NOTE) The GeneXpert MRSA Assay (FDA approved for NASAL specimens only), is one component of a comprehensive  MRSA colonization surveillance program. It is not intended to diagnose MRSA infection nor to guide or monitor treatment for MRSA infections. Test performance is not FDA approved in patients less than 71 years old. Performed at [facility], [place] [address] [place], [place] [address]   Resp Panel by RT-PCR (Flu A&B, Covid) Nasopharyngeal Swab     Status: None   Collection Time: 11/27/20  1:31 AM   Specimen: Nasopharyngeal Swab; Nasopharyngeal(NP) swabs in vial transport medium  Result Value Ref Range Status   SARS Coronavirus 2 by RT PCR NEGATIVE NEGATIVE Final    Comment: (NOTE) SARS-CoV-2 target nucleic acids are NOT DETECTED.  The SARS-CoV-2 RNA is generally detectable in upper respiratory specimens during the acute phase of infection. The lowest concentration of SARS-CoV-2 viral copies this assay can detect is 138 copies/mL. A negative result does not preclude SARS-Cov-2 infection and should not be used as the sole basis for treatment or other patient management decisions. A negative result may occur with  improper specimen collection/handling, submission of specimen other than nasopharyngeal swab, presence of viral mutation(s) within the areas targeted by this assay, and inadequate number of viral copies(<138 copies/mL). A negative result must be combined with clinical observations, patient history, and epidemiological information. The expected result is Negative.  Fact Sheet for Patients:  EntrepreneurPulse.com.au  Fact Sheet for Healthcare Providers:  IncredibleEmployment.be  This test is no t yet approved or cleared by the [place] FDA and  has been authorized for detection and/or diagnosis of SARS-CoV-2 by FDA under an Emergency Use Authorization (EUA). This EUA will remain  in effect (meaning this test can be used) for the duration of the COVID-19 declaration under Section 564(b)(1) of the Act, 21 U.S.C.section  360bbb-3(b)(1), unless the authorization is terminated  or revoked sooner.       Influenza A by PCR NEGATIVE NEGATIVE Final   Influenza B by PCR NEGATIVE NEGATIVE Final    Comment: (NOTE) The Xpert Xpress SARS-CoV-2/FLU/RSV plus assay is intended as an aid in the diagnosis of influenza from Nasopharyngeal swab specimens and should not be used as a sole basis for treatment. Nasal washings and aspirates are unacceptable for Xpert Xpress SARS-CoV-2/FLU/RSV testing.  Fact Sheet for Patients: EntrepreneurPulse.com.au  Fact Sheet for Healthcare Providers: IncredibleEmployment.be  This test is not yet approved or cleared by the [place] FDA and has been authorized for detection and/or diagnosis of SARS-CoV-2 by FDA under an Emergency Use Authorization (EUA). This EUA will remain in effect (meaning this test can be used) for the duration of the COVID-19 declaration under Section 564(b)(1) of the Act, 21 U.S.C. section 360bbb-3(b)(1), unless the authorization is terminated or revoked.  Performed at [facility], [place] [address], [place], [place] [address]   Urine Culture     Status: None   Collection Time: 11/29/20  8:40 AM   Specimen: Urine, Clean Catch  Result Value Ref Range Status   Specimen Description   Final    URINE, CLEAN CATCH Performed at [facility], [place] [address] [place], [place] [address]    Special Requests   Final    NONE Performed at [facility], [place] [address], [place], [place] [address]    Culture   Final    NO GROWTH Performed at [facility], [place] [address], [place], [place] [address]    Report Status 11/30/2020 FINAL  Final  Culture, blood (routine x 2)     Status: None (Preliminary result)   Collection Time: 11/29/20  8:43 AM   Specimen: BLOOD  Result Value Ref Range Status   Specimen Description   Final    BLOOD RIGHT ARM Performed at [place] [address], [place], [place] [address]    Special Requests   Final    BOTTLES DRAWN AEROBIC AND ANAEROBIC Blood Culture adequate volume Performed at [place] [address], [place], [place] [address]    Culture   Final    NO GROWTH 2 DAYS Performed at [place] [address], [place], [place] [address]    Report Status PENDING  Incomplete  Culture, blood (routine x 2)     Status: None (Preliminary result)   Collection Time: 11/29/20  8:49 AM   Specimen: BLOOD  Result Value Ref Range Status   Specimen Description   Final    BLOOD LEFT ANTECUBITAL Performed at [place] [address] [place], [place] [address]    Special Requests   Final    BOTTLES DRAWN AEROBIC AND ANAEROBIC Blood Culture adequate volume Performed at [place] [address], [place], [place] [address]    Culture   Final    NO GROWTH 2 DAYS Performed at [place] [address], [place], [place] [address]    Report Status PENDING  Incomplete    Radiology Studies: No results found.  Scheduled Meds:  enoxaparin (LOVENOX) injection  30 mg Subcutaneous Q24H   feeding supplement  237 mL Oral TID BM   multivitamin with minerals  1 tablet Oral Daily   mupirocin ointment   Topical BID   potassium chloride  40 mEq Oral Once   Continuous Infusions:  cefTRIAXone (ROCEPHIN)  IV Stopped (12/01/20 1804)   dextrose 50 mL/hr at 12/02/20 1010   metronidazole Stopped (12/02/20 0057)   potassium chloride 10 mEq (12/02/20 1133)     LOS: 3 days    Time spent: 25 mins    Alleta Avery, MD [facility]   If 7PM-7AM, please contact night-coverage

## 2020-12-02 NOTE — Progress Notes (Signed)
Daily Progress Note   Patient Name: Christopher Gross       Date: 12/02/2020 DOB: November 27, 1924  Age: 87 y.o. MRN#: [id number] Attending Physician: Cipriano Bunker, MD Primary Care Physician: Etta Grandchild, MD Admit Date: 11/26/2020  Reason for Consultation/Follow-up: Establishing goals of care  Subjective: Continues to appear awake and reasonably alert, not in any distress, it appears that the patient is able to answer very few yes/no type questions appropriately today, does not follow any commands.  Next today, no    Length of Stay: 3  Current Medications: Scheduled Meds:   enoxaparin (LOVENOX) injection  30 mg Subcutaneous Q24H   feeding supplement  237 mL Oral TID BM   multivitamin with minerals  1 tablet Oral Daily   mupirocin ointment   Topical BID   potassium chloride  40 mEq Oral Once    Continuous Infusions:  cefTRIAXone (ROCEPHIN)  IV Stopped (12/01/20 1804)   dextrose 50 mL/hr at 12/02/20 1010   metronidazole Stopped (12/02/20 0057)   potassium chloride 10 mEq (12/02/20 1133)    PRN Meds: acetaminophen **OR** acetaminophen, LORazepam, ondansetron **OR** ondansetron (ZOFRAN) IV  Physical Exam           awake alert No distress Regular work of breathing S 1 S 2  Abdomen is not distended Wearing mittens  Vital Signs: BP (!) 138/109   Pulse 91   Temp 98.3 F (36.8 C) (Axillary)   Resp 18   Ht 5\' 4"  (1.626 m)   Wt 49.9 kg   SpO2 91%   BMI 18.88 kg/m  SpO2: SpO2: 91 % O2 Device: O2 Device: Room Air O2 Flow Rate:    Intake/output summary:  Intake/Output Summary (Last 24 hours) at 12/02/2020 1215 Last data filed at 12/02/2020 1010 Gross per 24 hour  Intake 2552.65 ml  Output 1100 ml  Net 1452.65 ml    LBM: Last BM Date: 12/02/20 Baseline Weight: Weight:  49.9 kg Most recent weight: Weight: 49.9 kg       Palliative Assessment/Data:      Patient Active Problem List   Diagnosis Date Noted   Hypernatremia 11/30/2020   Fever 11/29/2020   Fall 11/29/2020   Pneumonia 11/28/2020   Dysphagia 11/28/2020   Fall at home, initial encounter 11/27/2020   Abnormal CT scan 11/27/2020   Infection  of great toe 11/26/2020   Acute encephalopathy 11/26/2020   Dementia (HCC) 11/26/2020   GAD (generalized anxiety disorder) 04/21/2020   Goals of care, counseling/discussion 02/16/2020   Deficiency anemia 02/13/2020   Cough with sputum 02/13/2020   Inguinal hernia of left side without obstruction or gangrene 02/13/2020   BPH (benign prostatic hypertrophy) 03/19/2014   Routine general medical examination at a health care facility 03/19/2014    Palliative Care Assessment & Plan   Patient Profile:    Assessment:  87 yrs old male with PMH significant of BPH, dementia who presented with s/p mechanical fall.  He was noted to have ingrown toenail on L first toe with erythema of first toe and was started on abx with podiatry consult.  Podiatry recommending abx only and with low suspicion for osteomyelitis. Hospitalization complicated by delirium and aspiration pneumonia.  Palliative care following for GOC.   Recommendations/Plan:  Continue the trial of comfort feeds See if the patient is able to participate with PT OT: Will recommend skilled nursing facility for rehabilitation attempt with palliative services following if the patient is able to have some p.o. intake and if he is able to participate to some extent with PT/OT.    Code Status:    Code Status Orders  (From admission, onward)           Start     Ordered   11/28/20 1133  Do not attempt resuscitation (DNR)  Continuous       Question Answer Comment  In the event of cardiac or respiratory ARREST Do not call a "code blue"   In the event of cardiac or respiratory ARREST Do not perform  Intubation, CPR, defibrillation or ACLS   In the event of cardiac or respiratory ARREST Use medication by any route, position, wound care, and other measures to relive pain and suffering. May use oxygen, suction and manual treatment of airway obstruction as needed for comfort.      11/28/20 1132           Code Status History     Date Active Date Inactive Code Status Order ID Comments User Context   11/26/2020 2353 11/28/2020 1132 Full Code [id number]  Hillary Bow, DO ED       Prognosis:  Unable to determine  Discharge Planning: To Be Determined  Care plan was discussed with IDT.   Thank you for allowing the Palliative Medicine Team to assist in the care of this patient.   Time In: 11 Time Out: 11.15 Total Time 15  Prolonged Time Billed  no       Greater than 50%  of this time was spent counseling and coordinating care related to the above assessment and plan.  Rosalin Hawking, MD  Please contact Palliative Medicine Team phone at [phone] for questions and concerns.

## 2020-12-03 ENCOUNTER — Inpatient Hospital Stay (HOSPITAL_COMMUNITY): Payer: Medicare HMO

## 2020-12-03 LAB — CBC
HCT: 35 % — ABNORMAL LOW (ref 39.0–52.0)
Hemoglobin: 11.2 g/dL — ABNORMAL LOW (ref 13.0–17.0)
MCH: 29.6 pg (ref 26.0–34.0)
MCHC: 32 g/dL (ref 30.0–36.0)
MCV: 92.3 fL (ref 80.0–100.0)
Platelets: 289 10*3/uL (ref 150–400)
RBC: 3.79 MIL/uL — ABNORMAL LOW (ref 4.22–5.81)
RDW: 13.6 % (ref 11.5–15.5)
WBC: 9.3 10*3/uL (ref 4.0–10.5)
nRBC: 0 % (ref 0.0–0.2)

## 2020-12-03 LAB — PHOSPHORUS: Phosphorus: 2.6 mg/dL (ref 2.5–4.6)

## 2020-12-03 LAB — BASIC METABOLIC PANEL
Anion gap: 6 (ref 5–15)
BUN: 21 mg/dL (ref 8–23)
CO2: 27 mmol/L (ref 22–32)
Calcium: 9.1 mg/dL (ref 8.9–10.3)
Chloride: 107 mmol/L (ref 98–111)
Creatinine, Ser: 0.97 mg/dL (ref 0.61–1.24)
GFR, Estimated: >60 mL/min (ref >60)
Glucose, Bld: 110 mg/dL — ABNORMAL HIGH (ref 70–99)
Potassium: 3.7 mmol/L (ref 3.5–5.1)
Sodium: 140 mmol/L (ref 135–145)

## 2020-12-03 LAB — PROCALCITONIN: Procalcitonin: 0.23 ng/mL

## 2020-12-03 LAB — MAGNESIUM: Magnesium: 1.9 mg/dL (ref 1.7–2.4)

## 2020-12-03 NOTE — Progress Notes (Signed)
PROGRESS NOTE    Christopher Gross  [id number] DOB: 20-Jan-1924 DOA: 11/26/2020 PCP: Janith Lima, MD   Brief Narrative:  This 87 yrs old male with PMH significant of BPH, dementia who presented with s/p mechanical fall.  He was noted to have ingrown toenail on L first toe with erythema of first toe and was started on abx with podiatry consult.  Podiatry recommending abx only and with low suspicion for osteomyelitis. Hospitalization complicated by delirium and aspiration pneumonia. Palliative care is following, now planning for comfort feeds, will see how he does on abx next day or so and continue further goc discussions.    Assessment & Plan:   Principal Problem:   Fall at home, initial encounter Active Problems:   Goals of care, counseling/discussion   Infection of great toe   Acute encephalopathy   Dementia (HCC)   Abnormal CT scan   Pneumonia   Dysphagia   Fever   Fall   Hypernatremia   Palliative care by specialist   General weakness  Fall at home, initial encounter: CT head/neck without any acute findings. PT and OT eval once he is more cooperative.  Goals of care discussion:  Appreciate palliative care assistance, recommended comfort feeds with precaution.   Follow course for next days or so, then make further decisions regarding SNF with palliative vs hospice.  Pneumonia: CXR concerning for pneumonia with bibasilar infiltrates with pneumonia or aspiration. Continue ceftriaxone, added flagyl for possible Aspiration. Urine culture : No growth and Blood culture NGTD Speech eval recommended comfort feeds.  Dementia:  Stable.  Continue delirium precautions   Acute encephalopathy : Likely secondary to acute hospitalization, dementia, infection. Continue low-dose Ativan as needed. Continue delirium precautions. Continue soft mittens for safety.   Dysphagia: Speech eval.  Recommending comfort feeds.  Ingrown toenail left foot: MRI not diagnostic.  CRP 5.6, Sed  rate 57. Discussed with podiatry, recommended Keflex, mupirocin ointment and dressing changes around the nail. Continue ceftriaxone and Flagyl for pneumonia.   Hypernatremia : > Improved. This could be secondary to dehydration, hypotonic fluids.   Sodium improved.  Hypokalemia: Replaced and improved.  Abnormal CT scan: Spiculated density of visualized right upper lobe, will discuss with family --- at this time, can hold off on further workup.    DVT prophylaxis: Lovenox Code Status:DNR Family Communication: No family at bedside. Disposition Plan:   Status is: Inpatient  Remains inpatient appropriate because: Requiring IV antibiotics.  Delirium in setting of dementia.  Consultants:  Podiatry  Procedures: None Antimicrobials:  Anti-infectives (From admission, onward)    Start     Dose/Rate Route Frequency Ordered Stop   11/29/20 1315  metroNIDAZOLE (FLAGYL) IVPB 500 mg        500 mg 100 mL/hr over 60 Minutes Intravenous Every 12 hours 11/29/20 1222     11/28/20 1800  cefTRIAXone (ROCEPHIN) 1 g in sodium chloride 0.9 % 100 mL IVPB        1 g 200 mL/hr over 30 Minutes Intravenous Every 24 hours 11/28/20 1649     11/28/20 1215  ceFAZolin (ANCEF) IVPB 1 g/50 mL premix  Status:  Discontinued        1 g 100 mL/hr over 30 Minutes Intravenous Every 12 hours 11/28/20 1125 11/28/20 1649   11/27/20 1000  cefTRIAXone (ROCEPHIN) 2 g in sodium chloride 0.9 % 100 mL IVPB  Status:  Discontinued       See Hyperspace for full Linked Orders Report.   2 g 200  mL/hr over 30 Minutes Intravenous Every 24 hours 11/26/20 2349 11/28/20 1122   11/27/20 1000  metroNIDAZOLE (FLAGYL) IVPB 500 mg  Status:  Discontinued        500 mg 100 mL/hr over 60 Minutes Intravenous Every 12 hours 11/27/20 0048 11/28/20 1122   11/27/20 0000  metroNIDAZOLE (FLAGYL) tablet 500 mg  Status:  Discontinued       See Hyperspace for full Linked Orders Report.   500 mg Oral Every 12 hours 11/26/20 2349 11/27/20 0048    11/26/20 2345  piperacillin-tazobactam (ZOSYN) IVPB 3.375 g        3.375 g 100 mL/hr over 30 Minutes Intravenous  Once 11/26/20 2335 11/27/20 0013        Subjective: Patient was seen and examined at bedside.  Overnight events noted.   Patient seems slightly improved.  He is alert oriented x1, following partial commands.   Patient remains in mittens otherwise comfortable and calmer.  Objective: Vitals:   12/02/20 1434 12/02/20 2242 12/03/20 0630 12/03/20 1030  BP: (!) 148/68 138/76 (!) 148/59 107/62  Pulse: 78 76 65 71  Resp: 17 16 16 16   Temp:  (!) 97.5 F (36.4 C) (!) 97.4 F (36.3 C) 97.9 F (36.6 C)  TempSrc:  Oral Oral Oral  SpO2:  91% 93% 100%  Weight:      Height:        Intake/Output Summary (Last 24 hours) at 12/03/2020 1240 Last data filed at 12/03/2020 0149 Gross per 24 hour  Intake 839.12 ml  Output 400 ml  Net 439.12 ml   Filed Weights   11/26/20 2153  Weight: 49.9 kg    Examination:  General exam: Chronically ill looking, calmer, comfortable, not in any acute distress.  Deconditioned Respiratory system: Clear to auscultation bilaterally. Respiratory effort normal. RR 15 Cardiovascular system: S1-S2 heard, regular rate and rhythm, no murmur. Gastrointestinal system: Abdomen is soft, nontender, nondistended, BS+ Central nervous system: Alert and oriented X 1.  No focal neurological deficits. Extremities: No edema, no cyanosis, no clubbing. Skin: No rashes, lesions or ulcers Psychiatry: Judgement and insight appear normal. Mood & affect appropriate.     Data Reviewed: I have personally reviewed following labs and imaging studies  CBC: Recent Labs  Lab 11/28/20 0555 11/29/20 0847 11/30/20 0501 12/02/20 0538 12/03/20 0528  WBC 9.2 10.0 7.4 13.8* 9.3  NEUTROABS 7.9* 8.3*  --   --   --   HGB 10.7* 11.7* 12.7* 12.0* 11.2*  HCT 32.5* 36.0* 41.0 36.9* 35.0*  MCV 91.0 91.4 97.6 92.3 92.3  PLT 240 271 276 291 [id number]   Basic Metabolic Panel: Recent  Labs  Lab 11/28/20 0555 11/29/20 0847 11/30/20 0501 12/02/20 0538 12/03/20 0528  NA 140 145 146* 142 140  K 3.5 3.6 3.3* 2.8* 3.7  CL 106 112* 110 108 107  CO2 22 25 21* 24 27  GLUCOSE 71 82 91 107* 110*  BUN 30* 30* 31* 23 21  CREATININE 1.25* 1.29* 1.16 1.04 0.97  CALCIUM 8.8* 9.2 9.2 9.0 9.1  MG 1.9  --   --  1.8 1.9  PHOS 3.8  --   --  2.4* 2.6   GFR: Estimated Creatinine Clearance: 32.9 mL/min (by C-G formula based on SCr of 0.97 mg/dL). Liver Function Tests: Recent Labs  Lab 11/28/20 0555 11/29/20 0847  AST 26 29  ALT 18 16  ALKPHOS 89 86  BILITOT 1.0 0.8  PROT 6.5 6.6  ALBUMIN 3.0* 3.0*   No results for  input(s): LIPASE, AMYLASE in the last 168 hours. No results for input(s): AMMONIA in the last 168 hours. Coagulation Profile: No results for input(s): INR, PROTIME in the last 168 hours. Cardiac Enzymes: No results for input(s): CKTOTAL, CKMB, CKMBINDEX, TROPONINI in the last 168 hours. BNP (last 3 results) No results for input(s): PROBNP in the last 8760 hours. HbA1C: No results for input(s): HGBA1C in the last 72 hours. CBG: No results for input(s): GLUCAP in the last 168 hours. Lipid Profile: No results for input(s): CHOL, HDL, LDLCALC, TRIG, CHOLHDL, LDLDIRECT in the last 72 hours. Thyroid Function Tests: No results for input(s): TSH, T4TOTAL, FREET4, T3FREE, THYROIDAB in the last 72 hours. Anemia Panel: No results for input(s): VITAMINB12, FOLATE, FERRITIN, TIBC, IRON, RETICCTPCT in the last 72 hours. Sepsis Labs: No results for input(s): PROCALCITON, LATICACIDVEN in the last 168 hours.  Recent Results (from the past 240 hour(s))  MRSA Next Gen by PCR, Nasal     Status: None   Collection Time: 11/26/20 11:51 PM   Specimen: Nasal Mucosa; Nasal Swab  Result Value Ref Range Status   MRSA by PCR Next Gen NOT DETECTED NOT DETECTED Final    Comment: (NOTE) The GeneXpert MRSA Assay (FDA approved for NASAL specimens only), is one component of a  comprehensive MRSA colonization surveillance program. It is not intended to diagnose MRSA infection nor to guide or monitor treatment for MRSA infections. Test performance is not FDA approved in patients less than 69 years old. Performed at [facility], [place] [address] [place], [place] [address]   Resp Panel by RT-PCR (Flu A&B, Covid) Nasopharyngeal Swab     Status: None   Collection Time: 11/27/20  1:31 AM   Specimen: Nasopharyngeal Swab; Nasopharyngeal(NP) swabs in vial transport medium  Result Value Ref Range Status   SARS Coronavirus 2 by RT PCR NEGATIVE NEGATIVE Final    Comment: (NOTE) SARS-CoV-2 target nucleic acids are NOT DETECTED.  The SARS-CoV-2 RNA is generally detectable in upper respiratory specimens during the acute phase of infection. The lowest concentration of SARS-CoV-2 viral copies this assay can detect is 138 copies/mL. A negative result does not preclude SARS-Cov-2 infection and should not be used as the sole basis for treatment or other patient management decisions. A negative result may occur with  improper specimen collection/handling, submission of specimen other than nasopharyngeal swab, presence of viral mutation(s) within the areas targeted by this assay, and inadequate number of viral copies(<138 copies/mL). A negative result must be combined with clinical observations, patient history, and epidemiological information. The expected result is Negative.  Fact Sheet for Patients:  EntrepreneurPulse.com.au  Fact Sheet for Healthcare Providers:  IncredibleEmployment.be  This test is no t yet approved or cleared by the [place] FDA and  has been authorized for detection and/or diagnosis of SARS-CoV-2 by FDA under an Emergency Use Authorization (EUA). This EUA will remain  in effect (meaning this test can be used) for the duration of the COVID-19 declaration under Section 564(b)(1) of the Act,  21 U.S.C.section 360bbb-3(b)(1), unless the authorization is terminated  or revoked sooner.       Influenza A by PCR NEGATIVE NEGATIVE Final   Influenza B by PCR NEGATIVE NEGATIVE Final    Comment: (NOTE) The Xpert Xpress SARS-CoV-2/FLU/RSV plus assay is intended as an aid in the diagnosis of influenza from Nasopharyngeal swab specimens and should not be used as a sole basis for treatment. Nasal washings and aspirates are unacceptable for Xpert Xpress SARS-CoV-2/FLU/RSV testing.  Fact Sheet for Patients: BloggerCourse.com  Fact Sheet for Healthcare Providers: SeriousBroker.it  This test is not yet approved or cleared by the [place] FDA and has been authorized for detection and/or diagnosis of SARS-CoV-2 by FDA under an Emergency Use Authorization (EUA). This EUA will remain in effect (meaning this test can be used) for the duration of the COVID-19 declaration under Section 564(b)(1) of the Act, 21 U.S.C. section 360bbb-3(b)(1), unless the authorization is terminated or revoked.  Performed at [facility], [address] [place], [place] [address]   Urine Culture     Status: None   Collection Time: 11/29/20  8:40 AM   Specimen: Urine, Clean Catch  Result Value Ref Range Status   Specimen Description   Final    URINE, CLEAN CATCH Performed at [facility], [address], [place], [place] [address]    Special Requests   Final    NONE Performed at [facility], [address], [place], [place] [address]    Culture   Final    NO GROWTH Performed at [facility], [address], [place], [place] [address]    Report Status 11/30/2020 FINAL  Final  Culture, blood (routine x 2)     Status: None (Preliminary result)   Collection Time: 11/29/20  8:43 AM   Specimen: BLOOD  Result Value Ref Range Status   Specimen Description   Final    BLOOD RIGHT  ARM Performed at [facility], [address] [place], [place] [address]    Special Requests   Final    BOTTLES DRAWN AEROBIC AND ANAEROBIC Blood Culture adequate volume Performed at [facility], [address], [place], [place] [address]    Culture   Final    NO GROWTH 3 DAYS Performed at [facility], [address], [place], [place] [address]    Report Status PENDING  Incomplete  Culture, blood (routine x 2)     Status: None (Preliminary result)   Collection Time: 11/29/20  8:49 AM   Specimen: BLOOD  Result Value Ref Range Status   Specimen Description   Final    BLOOD LEFT ANTECUBITAL Performed at [facility], [address] [place], [place] [address]    Special Requests   Final    BOTTLES DRAWN AEROBIC AND ANAEROBIC Blood Culture adequate volume Performed at [facility], [address] [place], [place] [address]    Culture   Final    NO GROWTH 3 DAYS Performed at [facility], [address] [place], [place] [address]    Report Status PENDING  Incomplete    Radiology Studies: DG CHEST PORT 1 VIEW  Result Date: 12/02/2020 CLINICAL DATA:  Pneumonia EXAM: PORTABLE CHEST 1 VIEW COMPARISON:  Chest x-ray 11/28/2020 FINDINGS: Heart size is normal. Mediastinum appears grossly stable. Calcified plaques in the thoracic aorta. Hazy airspace opacities and prominent interstitial lung markings identified bilaterally, increased since previous study and right greater than left. No significant pleural effusion identified. No pneumothorax visualized. IMPRESSION: Increased patchy hazy airspace opacities and prominent interstitial opacities since previous study. Electronically Signed   By: Jannifer Hick M.D.   On: 12/02/2020 13:41    Scheduled Meds:  enoxaparin (LOVENOX) injection  30 mg Subcutaneous Q24H   feeding supplement  237 mL Oral TID BM   multivitamin with minerals  1 tablet Oral Daily    mupirocin ointment  Topical BID   potassium chloride  40 mEq Oral Once   Continuous Infusions:  cefTRIAXone (ROCEPHIN)  IV Stopped (12/02/20 1924)   dextrose 50 mL/hr at 12/03/20 [id number]   metronidazole 500 mg (12/03/20 0149)     LOS: 4 days    Time spent: 25 mins    Otelia Hettinger, MD [facility]   If 7PM-7AM, please contact night-coverage

## 2020-12-03 NOTE — Progress Notes (Signed)
PMT no charge note  Will request PT/OT evaluation today, continue current mode of care, no new PMT specific recommendations for today,continue to monitor PO intake.  No charge Rosalin Hawking MD [facility].

## 2020-12-03 NOTE — Care Management Important Message (Signed)
Important Message  Patient Details IM Letter placed in Patients room. Name: Christopher Gross MRN: [id number] Date of Birth: 03/06/1924   Medicare Important Message Given:  Yes     Caren Macadam 12/03/2020, 12:09 PM

## 2020-12-04 DIAGNOSIS — Z515 Encounter for palliative care: Secondary | ICD-10-CM

## 2020-12-04 LAB — CULTURE, BLOOD (ROUTINE X 2)
Culture: NO GROWTH
Culture: NO GROWTH
Special Requests: ADEQUATE
Special Requests: ADEQUATE

## 2020-12-04 NOTE — Evaluation (Signed)
Physical Therapy Evaluation Patient Details Name: Christopher Gross MRN: [id number] DOB: 28-Aug-1924 Today's Date: 12/04/2020  History of Present Illness  87 yrs old male with PMH significant of BPH, dementia who presented with s/p mechanical fall.  He was noted to have ingrown toenail on L first toe with erythema of first toe and was started on abx with podiatry consult.  Podiatry recommending abx only and with low suspicion for osteomyelitis. Hospitalization complicated by delirium and aspiration pneumonia. Palliative care is following, now planning for comfort feeds, continuign GOC discussions  Clinical Impression  Pt admitted with above diagnosis.  Pt was amb prior to admission per RN (per wife's report). Today pt is unable to follow any basic commands, he is resistant to imposed movement of LEs. We were Christopher to sit pt EOB with total assist of 2.  Pt does not demonstrate ability to follow commands, participate with PT/process and learn information. Pt is an unlikely rehab candidate, however will follow for a trial of PT in the acute setting.  Will update plan base on any GOC changes  Pt currently with functional limitations due to the deficits listed below (see PT Problem List). Pt will benefit from skilled PT to increase their independence and safety with mobility to allow discharge to the venue listed below.          Recommendations for follow up therapy are one component of a multi-disciplinary discharge planning process, led by the attending physician.  Recommendations may be updated based on patient status, additional functional criteria and insurance authorization.  Follow Up Recommendations No PT follow up (vs SNF if Christopher to participate)    Assistance Recommended at Discharge    Functional Status Assessment Patient has had a recent decline in their functional status and demonstrates the ability to make significant improvements in function in a reasonable and predictable amount of time.   Equipment Recommendations  None recommended by PT    Recommendations for Other Services       Precautions / Restrictions Precautions Precautions: Fall Restrictions Weight Bearing Restrictions: No      Mobility  Bed Mobility Overal bed mobility: Needs Assistance Bed Mobility: Supine to Sit;Sit to Supine     Supine to sit: Total assist;+2 for physical assistance;+2 for safety/equipment Sit to supine: Total assist;+2 for physical assistance;+2 for safety/equipment   General bed mobility comments: assist with bil LEs off bed and trunk to upright, assist to return to supine. total assist of 2 to scoot up toward [facility]    Transfers                   General transfer comment: deferred/unable    Ambulation/Gait               General Gait Details: deferred/unable  Stairs            Wheelchair Mobility    Modified Rankin (Stroke Patients Only)       Balance Overall balance assessment: Needs assistance Sitting-balance support: Feet supported;Single extremity supported;No upper extremity supported Sitting balance-Leahy Scale: Poor Sitting balance - Comments: pt with trunk flexed, anterior lean in sitting. very briefly Christopher to maintain sitting  EOB with close supervision/min/guard, otherwise requires assist to maintain midline Postural control: Other (comment)                                   Pertinent Vitals/Pain Pain Assessment: Faces Faces Pain Scale:  Hurts a little bit Breathing: normal Negative Vocalization: none Facial Expression: smiling or inexpressive Body Language: relaxed Consolability: no need to console PAINAD Score: 0 Pain Location: grimaces with initiation of mobility Pain Descriptors / Indicators: Grimacing Pain Intervention(s): Monitored during session    Home Living Family/patient expects to be discharged to:: Skilled nursing facility Living Arrangements: Spouse/significant other                       Prior Function Prior Level of Function : Patient poor historian/Family not available             Mobility Comments: per RN pt wife states pt was Christopher to amb,often wanderign out of the house and the nbors would return him       Hand Dominance        Extremity/Trunk Assessment   Upper Extremity Assessment Upper Extremity Assessment: Defer to OT evaluation    Lower Extremity Assessment Lower Extremity Assessment: Generalized weakness;RLE deficits/detail;LLE deficits/detail RLE Deficits / Details: ankle to neutral, resistant to imposed movement, grossly 25 degree hamstring contracture LLE Deficits / Details: same as above, lesser hamstring contracture ~ 15 degrees    Cervical / Trunk Assessment Cervical / Trunk Assessment: Kyphotic  Communication      Cognition Arousal/Alertness: Awake/alert (seems sleepy) Behavior During Therapy: Flat affect Overall Cognitive Status: History of cognitive impairments - at baseline Area of Impairment: Following commands                       Following Commands: Follows one step commands inconsistently;Follows multi-step commands inconsistently       General Comments: pt is sleepy but arouses, opens eyes fully once on EOB, does not verbalize, does not follow any basic commands        General Comments      Exercises     Assessment/Plan    PT Assessment Patient needs continued PT services  PT Problem List Decreased strength;Decreased mobility;Decreased balance;Decreased cognition       PT Treatment Interventions Therapeutic activities;Therapeutic exercise;Patient/family education;Functional mobility training;Balance training    PT Goals (Current goals can be found in the Care Plan section)  Acute Rehab PT Goals Patient Stated Goal: unable to state PT Goal Formulation: Patient unable to participate in goal setting Time For Goal Achievement: 12/17/20 Potential to Achieve Goals: Poor    Frequency Min 1X/week    Barriers to discharge        Co-evaluation               AM-PAC PT "6 Clicks" Mobility  Outcome Measure Help needed turning from your back to your side while in a flat bed without using bedrails?: Total Help needed moving from lying on your back to sitting on the side of a flat bed without using bedrails?: Total Help needed moving to and from a bed to a chair (including a wheelchair)?: Total Help needed standing up from a chair using your arms (e.g., wheelchair or bedside chair)?: Total Help needed to walk in hospital room?: Total Help needed climbing 3-5 steps with a railing? : Total 6 Click Score: 6    End of Session   Activity Tolerance: Other (comment) (limited by cognition) Patient left: with call bell/phone within reach;in bed;with bed alarm set Nurse Communication: Mobility status PT Visit Diagnosis: Other abnormalities of gait and mobility (R26.89);Muscle weakness (generalized) (M62.81)    Time: 6270-3500 PT Time Calculation (min) (ACUTE ONLY): 21 min   Charges:  PT Evaluation $PT Eval Low Complexity: 1 Low          Jozlynn Plaia, PT  Acute Rehab Dept [facility]) [phone] Pager [phone]  12/04/2020   [facility] 12/04/2020, 1:39 PM

## 2020-12-04 NOTE — Progress Notes (Signed)
Daily Progress Note   Patient Name: Christopher Gross       Date: 12/04/2020 DOB: [birth year]  Age: 87 y.o. MRN#: [id number] Attending Physician: Cipriano Bunker, MD Primary Care Physician: Etta Grandchild, MD Admit Date: 11/26/2020  Reason for Consultation/Follow-up: Establishing goals of care  Subjective: Resting in bed, still in restraints, discussed with wife on the phone.    Length of Stay: 5  Current Medications: Scheduled Meds:   enoxaparin (LOVENOX) injection  30 mg Subcutaneous Q24H   feeding supplement  237 mL Oral TID BM   multivitamin with minerals  1 tablet Oral Daily   mupirocin ointment   Topical BID   potassium chloride  40 mEq Oral Once    Continuous Infusions:  cefTRIAXone (ROCEPHIN)  IV 1 g (12/03/20 1717)   dextrose 50 mL/hr at 12/04/20 1004   metronidazole Stopped (12/04/20 0221)    PRN Meds: acetaminophen **OR** acetaminophen, LORazepam, ondansetron **OR** ondansetron (ZOFRAN) IV  Physical Exam           Awakens easily No distress Regular work of breathing S 1 S 2  Abdomen is not distended Wearing mittens  Vital Signs: BP (!) 154/85 Comment: RN notified  Pulse 66   Temp 97.9 F (36.6 C) (Oral)   Resp 16   Ht 5\' 4"  (1.626 m)   Wt 49.9 kg   SpO2 96%   BMI 18.88 kg/m  SpO2: SpO2: 96 % O2 Device: O2 Device: Room Air O2 Flow Rate:    Intake/output summary:  Intake/Output Summary (Last 24 hours) at 12/04/2020 1118 Last data filed at 12/04/2020 1107 Gross per 24 hour  Intake 1325.87 ml  Output 200 ml  Net 1125.87 ml    LBM: Last BM Date: 12/03/20 Baseline Weight: Weight: 49.9 kg Most recent weight: Weight: 49.9 kg       Palliative Assessment/Data:      Patient Active Problem List   Diagnosis Date Noted   Palliative care by  specialist    General weakness    Hypernatremia 11/30/2020   Fever 11/29/2020   Fall 11/29/2020   Pneumonia 11/28/2020   Dysphagia 11/28/2020   Fall at home, initial encounter 11/27/2020   Abnormal CT scan 11/27/2020   Infection of great toe 11/26/2020   Acute encephalopathy 11/26/2020   Dementia (HCC) 11/26/2020   GAD (  generalized anxiety disorder) 04/21/2020   Goals of care, counseling/discussion 02/16/2020   Deficiency anemia 02/13/2020   Cough with sputum 02/13/2020   Inguinal hernia of left side without obstruction or gangrene 02/13/2020   BPH (benign prostatic hypertrophy) 03/19/2014   Routine general medical examination at a health care facility 03/19/2014    Palliative Care Assessment & Plan   Patient Profile:    Assessment:  87 yrs old male with PMH significant of BPH, dementia who presented with s/p mechanical fall.  He was noted to have ingrown toenail on L first toe with erythema of first toe and was started on abx with podiatry consult.  Podiatry recommending abx only and with low suspicion for osteomyelitis. Hospitalization complicated by delirium and aspiration pneumonia.  Palliative care following for GOC.   Recommendations/Plan:  Continue the trial of comfort feeds See if the patient is able to participate with PT OT: Will recommend skilled nursing facility for rehabilitation attempt with palliative services following if the patient is able to have some p.o. intake and if he is able to participate to some extent with PT/OT. Discussed with wife about possible PT eval today. She is encouraged by the patient's PO intake, wishes for SNF rehab with palliative.     Code Status:    Code Status Orders  (From admission, onward)           Start     Ordered   11/28/20 1133  Do not attempt resuscitation (DNR)  Continuous       Question Answer Comment  In the event of cardiac or respiratory ARREST Do not call a "code blue"   In the event of cardiac or respiratory  ARREST Do not perform Intubation, CPR, defibrillation or ACLS   In the event of cardiac or respiratory ARREST Use medication by any route, position, wound care, and other measures to relive pain and suffering. May use oxygen, suction and manual treatment of airway obstruction as needed for comfort.      11/28/20 1132           Code Status History     Date Active Date Inactive Code Status Order ID Comments User Context   11/26/2020 2353 11/28/2020 1132 Full Code [id number]  Hillary Bow, DO ED       Prognosis:  Unable to determine  Discharge Planning: To Be Determined  Care plan was discussed with wife on the phone.   Thank you for allowing the Palliative Medicine Team to assist in the care of this patient.   Time In: 11 Time Out: 11.25 Total Time 25 Prolonged Time Billed  no       Greater than 50%  of this time was spent counseling and coordinating care related to the above assessment and plan.  Rosalin Hawking, MD  Please contact Palliative Medicine Team phone at [phone] for questions and concerns.

## 2020-12-04 NOTE — Progress Notes (Signed)
PROGRESS NOTE    KENSHIN CARLBERG  [id number] DOB: 1924-05-28 DOA: 11/26/2020 PCP: Janith Lima, MD   Brief Narrative:  This 87 yrs old male with PMH significant of BPH, dementia who presented with s/p mechanical fall.  He was noted to have ingrown toenail on L first toe with erythema of first toe and was started on abx with podiatry consult.  Podiatry recommending abx only and with low suspicion for osteomyelitis. Hospitalization complicated by delirium and aspiration pneumonia. Palliative care is following, now planning for comfort feeds, will see how he does on abx next day or so and continue further goc discussions.    Assessment & Plan:   Principal Problem:   Fall at home, initial encounter Active Problems:   Goals of care, counseling/discussion   Infection of great toe   Acute encephalopathy   Dementia (HCC)   Abnormal CT scan   Pneumonia   Dysphagia   Fever   Fall   Hypernatremia   Palliative care by specialist   General weakness  Fall at home, initial encounter: CT head/neck without any acute findings. PT and OT eval once he is more cooperative. PT evaluation is going to happen today.  Goals of care discussion:  Appreciate palliative care assistance, recommended comfort feeds with precaution.   Follow course for next days or so, then make further decisions regarding SNF with palliative vs hospice.  Pneumonia: CXR concerning for pneumonia with bibasilar infiltrates with pneumonia or aspiration. Continue ceftriaxone, added flagyl for possible Aspiration. Urine culture : No growth and Blood culture NGTD Speech eval recommended comfort feeds. Treat with 7 days of antibiotics ( last day 11/27 ).  Dementia:  Stable.  Continue delirium precautions   Acute encephalopathy : Likely secondary to acute hospitalization, dementia, infection. Continue low-dose Ativan as needed. Continue delirium precautions. Continue soft mittens for safety.   Dysphagia: Speech  eval.  Recommending comfort feeds.  Ingrown toenail left foot: MRI not diagnostic.  CRP 5.6, Sed rate 57. Discussed with podiatry, recommended Keflex, mupirocin ointment and dressing changes around the nail. Continue ceftriaxone and Flagyl for pneumonia.   Hypernatremia : > Improved. This could be secondary to dehydration, hypotonic fluids.   Sodium improved.  Hypokalemia: Replaced and improved.  Abnormal CT scan: Spiculated density of visualized right upper lobe, will discuss with family --- at this time, can hold off on further workup.    DVT prophylaxis: Lovenox Code Status:DNR Family Communication: No family at bedside. Disposition Plan:   Status is: Inpatient  Remains inpatient appropriate because: Requiring IV antibiotics.  Delirium in setting of dementia.  PT and OT evaluation and possible SNF.  Consultants:  Podiatry  Procedures: None Antimicrobials:  Anti-infectives (From admission, onward)    Start     Dose/Rate Route Frequency Ordered Stop   11/29/20 1315  metroNIDAZOLE (FLAGYL) IVPB 500 mg        500 mg 100 mL/hr over 60 Minutes Intravenous Every 12 hours 11/29/20 1222     11/28/20 1800  cefTRIAXone (ROCEPHIN) 1 g in sodium chloride 0.9 % 100 mL IVPB        1 g 200 mL/hr over 30 Minutes Intravenous Every 24 hours 11/28/20 1649     11/28/20 1215  ceFAZolin (ANCEF) IVPB 1 g/50 mL premix  Status:  Discontinued        1 g 100 mL/hr over 30 Minutes Intravenous Every 12 hours 11/28/20 1125 11/28/20 1649   11/27/20 1000  cefTRIAXone (ROCEPHIN) 2 g in sodium chloride 0.9 %  100 mL IVPB  Status:  Discontinued       See Hyperspace for full Linked Orders Report.   2 g 200 mL/hr over 30 Minutes Intravenous Every 24 hours 11/26/20 2349 11/28/20 1122   11/27/20 1000  metroNIDAZOLE (FLAGYL) IVPB 500 mg  Status:  Discontinued        500 mg 100 mL/hr over 60 Minutes Intravenous Every 12 hours 11/27/20 0048 11/28/20 1122   11/27/20 0000  metroNIDAZOLE (FLAGYL) tablet 500  mg  Status:  Discontinued       See Hyperspace for full Linked Orders Report.   500 mg Oral Every 12 hours 11/26/20 2349 11/27/20 0048   11/26/20 2345  piperacillin-tazobactam (ZOSYN) IVPB 3.375 g        3.375 g 100 mL/hr over 30 Minutes Intravenous  Once 11/26/20 2335 11/27/20 0013        Subjective: Patient was seen and examined at bedside.  Overnight events noted.   Patient seems slightly improved.  He is alert, following partial commands Patient remains in mittens otherwise comfortable and calmer.  Objective: Vitals:   12/02/20 2242 12/03/20 0630 12/03/20 1030 12/03/20 1324  BP: 138/76 (!) 148/59 107/62 (!) 154/85  Pulse: 76 65 71 66  Resp: 16 16 16 16   Temp: (!) 97.5 F (36.4 C) (!) 97.4 F (36.3 C) 97.9 F (36.6 C)   TempSrc: Oral Oral Oral   SpO2: 91% 93% 100% 96%  Weight:      Height:        Intake/Output Summary (Last 24 hours) at 12/04/2020 1232 Last data filed at 12/04/2020 1107 Gross per 24 hour  Intake 1325.87 ml  Output 200 ml  Net 1125.87 ml   Filed Weights   11/26/20 2153  Weight: 49.9 kg    Examination:  General exam: Deconditioned, chronically ill looking, comfortable, NAD Respiratory system: CTA bilaterally.  Respiratory effort normal.  RR 15 Cardiovascular system: S1-S2 heard, regular rate and rhythm, no murmur. Gastrointestinal system: Abdomen is soft, nontender, nondistended, BS+ Central nervous system: Alert and oriented X 1.  No focal neurological deficits. Extremities: No edema, no cyanosis, no clubbing. Skin: No rashes, lesions or ulcers Psychiatry: Judgement and insight appear normal. Mood & affect appropriate.     Data Reviewed: I have personally reviewed following labs and imaging studies  CBC: Recent Labs  Lab 11/28/20 0555 11/29/20 0847 11/30/20 0501 12/02/20 0538 12/03/20 0528  WBC 9.2 10.0 7.4 13.8* 9.3  NEUTROABS 7.9* 8.3*  --   --   --   HGB 10.7* 11.7* 12.7* 12.0* 11.2*  HCT 32.5* 36.0* 41.0 36.9* 35.0*  MCV  91.0 91.4 97.6 92.3 92.3  PLT 240 271 276 291 289   Basic Metabolic Panel: Recent Labs  Lab 11/28/20 0555 11/29/20 0847 11/30/20 0501 12/02/20 0538 12/03/20 0528  NA 140 145 146* 142 140  K 3.5 3.6 3.3* 2.8* 3.7  CL 106 112* 110 108 107  CO2 22 25 21* 24 27  GLUCOSE 71 82 91 107* 110*  BUN 30* 30* 31* 23 21  CREATININE 1.25* 1.29* 1.16 1.04 0.97  CALCIUM 8.8* 9.2 9.2 9.0 9.1  MG 1.9  --   --  1.8 1.9  PHOS 3.8  --   --  2.4* 2.6   GFR: Estimated Creatinine Clearance: 32.9 mL/min (by C-G formula based on SCr of 0.97 mg/dL). Liver Function Tests: Recent Labs  Lab 11/28/20 0555 11/29/20 0847  AST 26 29  ALT 18 16  ALKPHOS 89 86  BILITOT  1.0 0.8  PROT 6.5 6.6  ALBUMIN 3.0* 3.0*   No results for input(s): LIPASE, AMYLASE in the last 168 hours. No results for input(s): AMMONIA in the last 168 hours. Coagulation Profile: No results for input(s): INR, PROTIME in the last 168 hours. Cardiac Enzymes: No results for input(s): CKTOTAL, CKMB, CKMBINDEX, TROPONINI in the last 168 hours. BNP (last 3 results) No results for input(s): PROBNP in the last 8760 hours. HbA1C: No results for input(s): HGBA1C in the last 72 hours. CBG: No results for input(s): GLUCAP in the last 168 hours. Lipid Profile: No results for input(s): CHOL, HDL, LDLCALC, TRIG, CHOLHDL, LDLDIRECT in the last 72 hours. Thyroid Function Tests: No results for input(s): TSH, T4TOTAL, FREET4, T3FREE, THYROIDAB in the last 72 hours. Anemia Panel: No results for input(s): VITAMINB12, FOLATE, FERRITIN, TIBC, IRON, RETICCTPCT in the last 72 hours. Sepsis Labs: Recent Labs  Lab 12/03/20 1256  PROCALCITON 0.23    Recent Results (from the past 240 hour(s))  MRSA Next Gen by PCR, Nasal     Status: None   Collection Time: 11/26/20 11:51 PM   Specimen: Nasal Mucosa; Nasal Swab  Result Value Ref Range Status   MRSA by PCR Next Gen NOT DETECTED NOT DETECTED Final    Comment: (NOTE) The GeneXpert MRSA Assay (FDA  approved for NASAL specimens only), is one component of a comprehensive MRSA colonization surveillance program. It is not intended to diagnose MRSA infection nor to guide or monitor treatment for MRSA infections. Test performance is not FDA approved in patients less than 74 years old. Performed at [facility], [place] [address] [place], [place] [address]   Resp Panel by RT-PCR (Flu A&B, Covid) Nasopharyngeal Swab     Status: None   Collection Time: 11/27/20  1:31 AM   Specimen: Nasopharyngeal Swab; Nasopharyngeal(NP) swabs in vial transport medium  Result Value Ref Range Status   SARS Coronavirus 2 by RT PCR NEGATIVE NEGATIVE Final    Comment: (NOTE) SARS-CoV-2 target nucleic acids are NOT DETECTED.  The SARS-CoV-2 RNA is generally detectable in upper respiratory specimens during the acute phase of infection. The lowest concentration of SARS-CoV-2 viral copies this assay can detect is 138 copies/mL. A negative result does not preclude SARS-Cov-2 infection and should not be used as the sole basis for treatment or other patient management decisions. A negative result may occur with  improper specimen collection/handling, submission of specimen other than nasopharyngeal swab, presence of viral mutation(s) within the areas targeted by this assay, and inadequate number of viral copies(<138 copies/mL). A negative result must be combined with clinical observations, patient history, and epidemiological information. The expected result is Negative.  Fact Sheet for Patients:  EntrepreneurPulse.com.au  Fact Sheet for Healthcare Providers:  IncredibleEmployment.be  This test is no t yet approved or cleared by the [place] FDA and  has been authorized for detection and/or diagnosis of SARS-CoV-2 by FDA under an Emergency Use Authorization (EUA). This EUA will remain  in effect (meaning this test can be used) for the duration of  the COVID-19 declaration under Section 564(b)(1) of the Act, 21 U.S.C.section 360bbb-3(b)(1), unless the authorization is terminated  or revoked sooner.       Influenza A by PCR NEGATIVE NEGATIVE Final   Influenza B by PCR NEGATIVE NEGATIVE Final    Comment: (NOTE) The Xpert Xpress SARS-CoV-2/FLU/RSV plus assay is intended as an aid in the diagnosis of influenza from Nasopharyngeal swab specimens and should not be used as a sole  basis for treatment. Nasal washings and aspirates are unacceptable for Xpert Xpress SARS-CoV-2/FLU/RSV testing.  Fact Sheet for Patients: EntrepreneurPulse.com.au  Fact Sheet for Healthcare Providers: IncredibleEmployment.be  This test is not yet approved or cleared by the [place] FDA and has been authorized for detection and/or diagnosis of SARS-CoV-2 by FDA under an Emergency Use Authorization (EUA). This EUA will remain in effect (meaning this test can be used) for the duration of the COVID-19 declaration under Section 564(b)(1) of the Act, 21 U.S.C. section 360bbb-3(b)(1), unless the authorization is terminated or revoked.  Performed at [facility], [place] [address], [place], [place] [address]   Urine Culture     Status: None   Collection Time: 11/29/20  8:40 AM   Specimen: Urine, Clean Catch  Result Value Ref Range Status   Specimen Description   Final    URINE, CLEAN CATCH Performed at [facility], [place] [address], [place], [place] [address]    Special Requests   Final    NONE Performed at [facility], [place] [address], [place], [place] [address]    Culture   Final    NO GROWTH Performed at [facility], [place] [address], [place], [place] [address]    Report Status 11/30/2020 FINAL  Final  Culture, blood (routine x 2)     Status: None   Collection Time: 11/29/20  8:43 AM   Specimen: BLOOD  Result Value Ref Range Status   Specimen  Description   Final    BLOOD RIGHT ARM Performed at [place] [address], [place], [place] [address]    Special Requests   Final    BOTTLES DRAWN AEROBIC AND ANAEROBIC Blood Culture adequate volume Performed at [place] [address], [place], [place] [address]    Culture   Final    NO GROWTH 5 DAYS Performed at [facility], [place] [address], [place], [place] [address]    Report Status 12/04/2020 FINAL  Final  Culture, blood (routine x 2)     Status: None   Collection Time: 11/29/20  8:49 AM   Specimen: BLOOD  Result Value Ref Range Status   Specimen Description   Final    BLOOD LEFT ANTECUBITAL Performed at [place] [address], [place], [place] [address]    Special Requests   Final    BOTTLES DRAWN AEROBIC AND ANAEROBIC Blood Culture adequate volume Performed at [place] [address] [place], [place] [address]    Culture   Final    NO GROWTH 5 DAYS Performed at [facility], [place] [address], [place], [place] [address]    Report Status 12/04/2020 FINAL  Final    Radiology Studies: DG CHEST PORT 1 VIEW  Result Date: 12/03/2020 CLINICAL DATA:  Follow-up pneumonia. EXAM: PORTABLE CHEST 1 VIEW COMPARISON:  12/02/2020 and older studies. FINDINGS: Patchy bilateral hazy airspace lung opacities and interstitial opacities are similar to the previous day's study. No new lung abnormalities. No convincing pleural effusion and no pneumothorax. Cardiac silhouette is normal in size. IMPRESSION: 1. No significant change from the previous day's exam. 2. Persistent interstitial and patchy bilateral hazy airspace lung opacities consistent with multifocal pneumonia Electronically Signed   By: Lajean Manes M.D.   On: 12/03/2020 14:08   DG CHEST PORT 1 VIEW  Result Date: 12/02/2020 CLINICAL DATA:  Pneumonia EXAM: PORTABLE CHEST 1 VIEW COMPARISON:  Chest  x-ray 11/28/2020 FINDINGS:  Heart size is normal. Mediastinum appears grossly stable. Calcified plaques in the thoracic aorta. Hazy airspace opacities and prominent interstitial lung markings identified bilaterally, increased since previous study and right greater than left. No significant pleural effusion identified. No pneumothorax visualized. IMPRESSION: Increased patchy hazy airspace opacities and prominent interstitial opacities since previous study. Electronically Signed   By: Ofilia Neas M.D.   On: 12/02/2020 13:41    Scheduled Meds:  enoxaparin (LOVENOX) injection  30 mg Subcutaneous Q24H   feeding supplement  237 mL Oral TID BM   multivitamin with minerals  1 tablet Oral Daily   mupirocin ointment   Topical BID   potassium chloride  40 mEq Oral Once   Continuous Infusions:  cefTRIAXone (ROCEPHIN)  IV 1 g (12/03/20 1717)   dextrose 50 mL/hr at 12/04/20 1004   metronidazole Stopped (12/04/20 0221)     LOS: 5 days    Time spent: 25 mins    Appolonia Ackert, MD [facility]   If 7PM-7AM, please contact night-coverage

## 2020-12-05 LAB — BASIC METABOLIC PANEL
Anion gap: 8 (ref 5–15)
BUN: 20 mg/dL (ref 8–23)
CO2: 24 mmol/L (ref 22–32)
Calcium: 8.7 mg/dL — ABNORMAL LOW (ref 8.9–10.3)
Chloride: 104 mmol/L (ref 98–111)
Creatinine, Ser: 0.95 mg/dL (ref 0.61–1.24)
GFR, Estimated: >60 mL/min (ref >60)
Glucose, Bld: 121 mg/dL — ABNORMAL HIGH (ref 70–99)
Potassium: 2.8 mmol/L — ABNORMAL LOW (ref 3.5–5.1)
Sodium: 136 mmol/L (ref 135–145)

## 2020-12-05 LAB — CBC
HCT: 30.8 % — ABNORMAL LOW (ref 39.0–52.0)
Hemoglobin: 10 g/dL — ABNORMAL LOW (ref 13.0–17.0)
MCH: 29.9 pg (ref 26.0–34.0)
MCHC: 32.5 g/dL (ref 30.0–36.0)
MCV: 91.9 fL (ref 80.0–100.0)
Platelets: 337 10*3/uL (ref 150–400)
RBC: 3.35 MIL/uL — ABNORMAL LOW (ref 4.22–5.81)
RDW: 13.6 % (ref 11.5–15.5)
WBC: 9.2 10*3/uL (ref 4.0–10.5)
nRBC: 0 % (ref 0.0–0.2)

## 2020-12-05 LAB — PHOSPHORUS: Phosphorus: 2.6 mg/dL (ref 2.5–4.6)

## 2020-12-05 LAB — MAGNESIUM: Magnesium: 1.9 mg/dL (ref 1.7–2.4)

## 2020-12-05 MED ORDER — POTASSIUM CHLORIDE 20 MEQ PO PACK: 40.0000 meq | PACK | Freq: Once | ORAL | Status: DC

## 2020-12-05 MED ORDER — POTASSIUM CHLORIDE 10 MEQ/100ML IV SOLN
10.0000 meq | INTRAVENOUS | Status: AC
  Administered 2020-12-05 (×3): 10 meq via INTRAVENOUS
  Filled 2020-12-05: qty 100

## 2020-12-05 NOTE — Progress Notes (Signed)
PROGRESS NOTE    Christopher NARDOZZI  BJS:[id number] DOB: 01-01-1925 DOA: 11/26/2020 PCP: Etta Grandchild, MD   Brief Narrative:  This 87 yrs old male with PMH significant of BPH, dementia who presented with s/p mechanical fall.  He was noted to have ingrown toenail on L first toe with erythema of first toe and was started on abx with podiatry consult.  Podiatry recommending abx only and with low suspicion for osteomyelitis. Hospitalization complicated by delirium and aspiration pneumonia. Palliative care is following, now planning for comfort feeds, will see how he does on abx next day or so and continue further goc discussions.    Assessment & Plan:   Principal Problem:   Fall at home, initial encounter Active Problems:   Goals of care, counseling/discussion   Infection of great toe   Acute encephalopathy   Dementia (HCC)   Abnormal CT scan   Pneumonia   Dysphagia   Fever   Fall   Hypernatremia   Palliative care by specialist   General weakness  Fall at home, initial encounter: CT head/neck without any acute findings. PT and OT eval once he is more cooperative. Patient was unable to follow any basic commands for PT eval.  Goals of care discussion:  Appreciate palliative care assistance, recommended comfort feeds with precaution.   Follow course for next days or so, then make further decisions regarding SNF with palliative vs hospice.  Pneumonia: CXR concerning for pneumonia with bibasilar infiltrates with pneumonia or aspiration. Continue ceftriaxone, added flagyl for possible Aspiration. Urine culture : No growth and Blood culture NGTD Speech eval recommended comfort feeds. Treat with 7 days of antibiotics ( last day 11/27 ).  Dementia:  Stable.  Continue delirium precautions   Acute encephalopathy : Likely secondary to acute hospitalization, dementia, infection. Continue low-dose Ativan as needed. Continue delirium precautions. Continue soft mittens for safety.    Dysphagia: Speech eval. Recommending comfort feeds.  Ingrown toenail left foot: MRI not diagnostic.  CRP 5.6, Sed rate 57. Discussed with podiatry, recommended Keflex, mupirocin ointment and dressing changes around the nail. Continue ceftriaxone and Flagyl for pneumonia.   Hypernatremia : > Improved. This could be secondary to dehydration, hypotonic fluids.   Sodium improved.  Hypokalemia: Replaced . Continue to monitor.  Abnormal CT scan: Spiculated density of visualized right upper lobe, will discuss with family --- at this time, can hold off on further workup.    DVT prophylaxis: Lovenox Code Status:DNR Family Communication: No family at bedside. Disposition Plan:   Status is: Inpatient  Remains inpatient appropriate because: Requiring IV antibiotics.  Delirium in setting of dementia.  PT and OT evaluation and possible SNF.  Consultants:  Podiatry  Procedures: None Antimicrobials:  Anti-infectives (From admission, onward)    Start     Dose/Rate Route Frequency Ordered Stop   11/29/20 1315  metroNIDAZOLE (FLAGYL) IVPB 500 mg        500 mg 100 mL/hr over 60 Minutes Intravenous Every 12 hours 11/29/20 1222     11/28/20 1800  cefTRIAXone (ROCEPHIN) 1 g in sodium chloride 0.9 % 100 mL IVPB        1 g 200 mL/hr over 30 Minutes Intravenous Every 24 hours 11/28/20 1649     11/28/20 1215  ceFAZolin (ANCEF) IVPB 1 g/50 mL premix  Status:  Discontinued        1 g 100 mL/hr over 30 Minutes Intravenous Every 12 hours 11/28/20 1125 11/28/20 1649   11/27/20 1000  cefTRIAXone (ROCEPHIN) 2  g in sodium chloride 0.9 % 100 mL IVPB  Status:  Discontinued       See Hyperspace for full Linked Orders Report.   2 g 200 mL/hr over 30 Minutes Intravenous Every 24 hours 11/26/20 2349 11/28/20 1122   11/27/20 1000  metroNIDAZOLE (FLAGYL) IVPB 500 mg  Status:  Discontinued        500 mg 100 mL/hr over 60 Minutes Intravenous Every 12 hours 11/27/20 0048 11/28/20 1122   11/27/20 0000   metroNIDAZOLE (FLAGYL) tablet 500 mg  Status:  Discontinued       See Hyperspace for full Linked Orders Report.   500 mg Oral Every 12 hours 11/26/20 2349 11/27/20 0048   11/26/20 2345  piperacillin-tazobactam (ZOSYN) IVPB 3.375 g        3.375 g 100 mL/hr over 30 Minutes Intravenous  Once 11/26/20 2335 11/27/20 0013        Subjective: Patient was seen and examined at bedside.  Overnight events noted.   Patient is lying comfortably on the bed.  He is alert and arousable,  not following commands today. Patient remains in mittens otherwise comfortable and calmer.  Objective: Vitals:   12/03/20 1324 12/04/20 1417 12/04/20 2012 12/05/20 0640  BP: (!) 154/85 (!) 117/52 (!) 155/82 (!) 102/46  Pulse: 66 93 91 86  Resp: 16 18 18 17   Temp:  97.6 F (36.4 C) 98 F (36.7 C) 97.9 F (36.6 C)  TempSrc:  Oral Oral Oral  SpO2: 96% 93% 90% 90%  Weight:      Height:        Intake/Output Summary (Last 24 hours) at 12/05/2020 1302 Last data filed at 12/05/2020 0641 Gross per 24 hour  Intake 500 ml  Output 100 ml  Net 400 ml   Filed Weights   11/26/20 2153  Weight: 49.9 kg    Examination:  General exam: Chronically ill looking, deconditioned, NAD.  Appears comfortable. Respiratory system: CTA bilaterally.  Respiratory effort normal.  RR 15 Cardiovascular system: S1-S2 heard, regular rate and rhythm, no murmur. Gastrointestinal system: Abdomen is soft, nontender, nondistended, BS+ Central nervous system: Alert and oriented X 1.  No focal neurological deficits. Extremities: No edema, no cyanosis, no clubbing. Skin: No rashes, lesions or ulcers Psychiatry: Mood & affect appropriate.     Data Reviewed: I have personally reviewed following labs and imaging studies  CBC: Recent Labs  Lab 11/29/20 0847 11/30/20 0501 12/02/20 0538 12/03/20 0528 12/05/20 0541  WBC 10.0 7.4 13.8* 9.3 9.2  NEUTROABS 8.3*  --   --   --   --   HGB 11.7* 12.7* 12.0* 11.2* 10.0*  HCT 36.0* 41.0 36.9*  35.0* 30.8*  MCV 91.4 97.6 92.3 92.3 91.9  PLT 271 276 291 289 [id number]   Basic Metabolic Panel: Recent Labs  Lab 11/29/20 0847 11/30/20 0501 12/02/20 0538 12/03/20 0528 12/05/20 0541  NA 145 146* 142 140 136  K 3.6 3.3* 2.8* 3.7 2.8*  CL 112* 110 108 107 104  CO2 25 21* 24 27 24   GLUCOSE 82 91 107* 110* 121*  BUN 30* 31* 23 21 20   CREATININE 1.29* 1.16 1.04 0.97 0.95  CALCIUM 9.2 9.2 9.0 9.1 8.7*  MG  --   --  1.8 1.9 1.9  PHOS  --   --  2.4* 2.6 2.6   GFR: Estimated Creatinine Clearance: 33.6 mL/min (by C-G formula based on SCr of 0.95 mg/dL). Liver Function Tests: Recent Labs  Lab 11/29/20 0847  AST 29  ALT 16  ALKPHOS 86  BILITOT 0.8  PROT 6.6  ALBUMIN 3.0*   No results for input(s): LIPASE, AMYLASE in the last 168 hours. No results for input(s): AMMONIA in the last 168 hours. Coagulation Profile: No results for input(s): INR, PROTIME in the last 168 hours. Cardiac Enzymes: No results for input(s): CKTOTAL, CKMB, CKMBINDEX, TROPONINI in the last 168 hours. BNP (last 3 results) No results for input(s): PROBNP in the last 8760 hours. HbA1C: No results for input(s): HGBA1C in the last 72 hours. CBG: No results for input(s): GLUCAP in the last 168 hours. Lipid Profile: No results for input(s): CHOL, HDL, LDLCALC, TRIG, CHOLHDL, LDLDIRECT in the last 72 hours. Thyroid Function Tests: No results for input(s): TSH, T4TOTAL, FREET4, T3FREE, THYROIDAB in the last 72 hours. Anemia Panel: No results for input(s): VITAMINB12, FOLATE, FERRITIN, TIBC, IRON, RETICCTPCT in the last 72 hours. Sepsis Labs: Recent Labs  Lab 12/03/20 1256  PROCALCITON 0.23    Recent Results (from the past 240 hour(s))  MRSA Next Gen by PCR, Nasal     Status: None   Collection Time: 11/26/20 11:51 PM   Specimen: Nasal Mucosa; Nasal Swab  Result Value Ref Range Status   MRSA by PCR Next Gen NOT DETECTED NOT DETECTED Final    Comment: (NOTE) The GeneXpert MRSA Assay (FDA approved for NASAL  specimens only), is one component of a comprehensive MRSA colonization surveillance program. It is not intended to diagnose MRSA infection nor to guide or monitor treatment for MRSA infections. Test performance is not FDA approved in patients less than 25 years old. Performed at [facility], [place] [address] [place], [place] [address]   Resp Panel by RT-PCR (Flu A&B, Covid) Nasopharyngeal Swab     Status: None   Collection Time: 11/27/20  1:31 AM   Specimen: Nasopharyngeal Swab; Nasopharyngeal(NP) swabs in vial transport medium  Result Value Ref Range Status   SARS Coronavirus 2 by RT PCR NEGATIVE NEGATIVE Final    Comment: (NOTE) SARS-CoV-2 target nucleic acids are NOT DETECTED.  The SARS-CoV-2 RNA is generally detectable in upper respiratory specimens during the acute phase of infection. The lowest concentration of SARS-CoV-2 viral copies this assay can detect is 138 copies/mL. A negative result does not preclude SARS-Cov-2 infection and should not be used as the sole basis for treatment or other patient management decisions. A negative result may occur with  improper specimen collection/handling, submission of specimen other than nasopharyngeal swab, presence of viral mutation(s) within the areas targeted by this assay, and inadequate number of viral copies(<138 copies/mL). A negative result must be combined with clinical observations, patient history, and epidemiological information. The expected result is Negative.  Fact Sheet for Patients:  EntrepreneurPulse.com.au  Fact Sheet for Healthcare Providers:  IncredibleEmployment.be  This test is no t yet approved or cleared by the [place] FDA and  has been authorized for detection and/or diagnosis of SARS-CoV-2 by FDA under an Emergency Use Authorization (EUA). This EUA will remain  in effect (meaning this test can be used) for the duration of the COVID-19 declaration  under Section 564(b)(1) of the Act, 21 U.S.C.section 360bbb-3(b)(1), unless the authorization is terminated  or revoked sooner.       Influenza A by PCR NEGATIVE NEGATIVE Final   Influenza B by PCR NEGATIVE NEGATIVE Final    Comment: (NOTE) The Xpert Xpress SARS-CoV-2/FLU/RSV plus assay is intended as an aid in the diagnosis of influenza from Nasopharyngeal swab specimens and should not be  used as a sole basis for treatment. Nasal washings and aspirates are unacceptable for Xpert Xpress SARS-CoV-2/FLU/RSV testing.  Fact Sheet for Patients: BloggerCourse.com  Fact Sheet for Healthcare Providers: SeriousBroker.it  This test is not yet approved or cleared by the [place] FDA and has been authorized for detection and/or diagnosis of SARS-CoV-2 by FDA under an Emergency Use Authorization (EUA). This EUA will remain in effect (meaning this test can be used) for the duration of the COVID-19 declaration under Section 564(b)(1) of the Act, 21 U.S.C. section 360bbb-3(b)(1), unless the authorization is terminated or revoked.  Performed at [facility], [address], [place], [place] [address]   Urine Culture     Status: None   Collection Time: 11/29/20  8:40 AM   Specimen: Urine, Clean Catch  Result Value Ref Range Status   Specimen Description   Final    URINE, CLEAN CATCH Performed at [facility], [address] [place], [place] [address]    Special Requests   Final    NONE Performed at [facility], [address] [place], [place] [address]    Culture   Final    NO GROWTH Performed at [facility], [address], [place], [place] [address]    Report Status 11/30/2020 FINAL  Final  Culture, blood (routine x 2)     Status: None   Collection Time: 11/29/20  8:43 AM   Specimen: BLOOD  Result Value Ref Range Status   Specimen Description   Final    BLOOD  RIGHT ARM Performed at [facility], [address] [place], [place] [address]    Special Requests   Final    BOTTLES DRAWN AEROBIC AND ANAEROBIC Blood Culture adequate volume Performed at [facility], [address], [place], [place] [address]    Culture   Final    NO GROWTH 5 DAYS Performed at [facility], [address], [place], [place] [address]    Report Status 12/04/2020 FINAL  Final  Culture, blood (routine x 2)     Status: None   Collection Time: 11/29/20  8:49 AM   Specimen: BLOOD  Result Value Ref Range Status   Specimen Description   Final    BLOOD LEFT ANTECUBITAL Performed at [facility], [address], [place], [place] [address]    Special Requests   Final    BOTTLES DRAWN AEROBIC AND ANAEROBIC Blood Culture adequate volume Performed at [facility], [address] [place], [place] [address]    Culture   Final    NO GROWTH 5 DAYS Performed at [facility], [address] [place], [place] [address]    Report Status 12/04/2020 FINAL  Final    Radiology Studies: DG CHEST PORT 1 VIEW  Result Date: 12/03/2020 CLINICAL DATA:  Follow-up pneumonia. EXAM: PORTABLE CHEST 1 VIEW COMPARISON:  12/02/2020 and older studies. FINDINGS: Patchy bilateral hazy airspace lung opacities and interstitial opacities are similar to the previous day's study. No new lung abnormalities. No convincing pleural effusion and no pneumothorax. Cardiac silhouette is normal in size. IMPRESSION: 1. No significant change from the previous day's exam. 2. Persistent interstitial and patchy bilateral hazy airspace lung opacities consistent with multifocal pneumonia Electronically Signed   By: Amie Portland M.D.   On: 12/03/2020 14:08    Scheduled Meds:  enoxaparin (LOVENOX) injection  30 mg Subcutaneous Q24H   feeding supplement  237 mL Oral TID BM   multivitamin with minerals  1 tablet Oral Daily   mupirocin  ointment   Topical BID   potassium chloride  40 mEq Oral Once   potassium chloride  40 mEq Oral Once   potassium chloride  40 mEq Oral Once   Continuous Infusions:  cefTRIAXone (ROCEPHIN)  IV 1 g (12/04/20 1735)   dextrose 50 mL/hr at 12/05/20 0049   metronidazole 500 mg (12/05/20 0050)   potassium chloride 10 mEq (12/05/20 1209)     LOS: 6 days    Time spent: 25 mins    Desmen Schoffstall, MD [facility]   If 7PM-7AM, please contact night-coverage

## 2020-12-05 NOTE — Progress Notes (Signed)
Pt stable at time of bedside reporting. No needs at time of report. Pt remains confused, weak, and tired. Pt has not wanted to eat most of day. Rn will continue to monitor.

## 2020-12-05 NOTE — Plan of Care (Signed)
  Problem: Safety: Goal: Non-violent Restraint(s) Outcome: Progressing   Problem: Health Behavior/Discharge Planning: Goal: Ability to manage health-related needs will improve Outcome: Progressing   Problem: Clinical Measurements: Goal: Ability to maintain clinical measurements within normal limits will improve Outcome: Progressing Goal: Will remain free from infection Outcome: Progressing Goal: Diagnostic test results will improve Outcome: Progressing Goal: Respiratory complications will improve Outcome: Progressing   Problem: Activity: Goal: Risk for activity intolerance will decrease Outcome: Progressing   Problem: Nutrition: Goal: Adequate nutrition will be maintained Outcome: Progressing   Problem: Coping: Goal: Level of anxiety will decrease Outcome: Progressing   Problem: Safety: Goal: Ability to remain free from injury will improve Outcome: Progressing   Problem: Skin Integrity: Goal: Risk for impaired skin integrity will decrease Outcome: Progressing

## 2020-12-06 LAB — BASIC METABOLIC PANEL
Anion gap: 8 (ref 5–15)
BUN: 17 mg/dL (ref 8–23)
CO2: 24 mmol/L (ref 22–32)
Calcium: 8.6 mg/dL — ABNORMAL LOW (ref 8.9–10.3)
Chloride: 103 mmol/L (ref 98–111)
Creatinine, Ser: 1.04 mg/dL (ref 0.61–1.24)
GFR, Estimated: >60 mL/min (ref >60)
Glucose, Bld: 101 mg/dL — ABNORMAL HIGH (ref 70–99)
Potassium: 3 mmol/L — ABNORMAL LOW (ref 3.5–5.1)
Sodium: 135 mmol/L (ref 135–145)

## 2020-12-06 MED ORDER — POTASSIUM CHLORIDE 10 MEQ/100ML IV SOLN
10.0000 meq | INTRAVENOUS | Status: AC
  Administered 2020-12-06 (×3): 10 meq via INTRAVENOUS
  Filled 2020-12-06 (×3): qty 100

## 2020-12-06 MED ORDER — POTASSIUM CHLORIDE 20 MEQ PO PACK
40.0000 meq | PACK | Freq: Once | ORAL | Status: AC
  Administered 2020-12-06: 11:00:00 40 meq via ORAL
  Filled 2020-12-06: qty 2

## 2020-12-06 NOTE — Progress Notes (Signed)
Daily Progress Note   Patient Name: Christopher Gross       Date: 12/06/2020 DOB: [birth year]  Age: 87 y.o. MRN#: [id number] Attending Physician: Cipriano Bunker, MD Primary Care Physician: Etta Grandchild, MD Admit Date: 11/26/2020  Reason for Consultation/Follow-up: Establishing goals of care  Subjective: Awake, discussed with nursing at bedside, eating few bites of applesauce, takes medications when crushed in applesauce, PT to possibly re attempt today. As per my discussions with his wife, she is hopeful for a SNF rehab attempt with continuation of trials of careful hand assisted comfort feedings.    Length of Stay: 7  Current Medications: Scheduled Meds:   enoxaparin (LOVENOX) injection  30 mg Subcutaneous Q24H   feeding supplement  237 mL Oral TID BM   multivitamin with minerals  1 tablet Oral Daily   mupirocin ointment   Topical BID    Continuous Infusions:  dextrose 50 mL/hr at 12/06/20 0622   potassium chloride 10 mEq (12/06/20 1251)    PRN Meds: acetaminophen **OR** acetaminophen, LORazepam, ondansetron **OR** ondansetron (ZOFRAN) IV  Physical Exam           Awake today No distress Regular work of breathing S 1 S 2  Abdomen is not distended Wearing mittens  Vital Signs: BP (!) 142/60 (BP Location: Left Arm)   Pulse 81   Temp (!) 97.5 F (36.4 C) (Axillary)   Resp 14   Ht 5\' 4"  (1.626 m)   Wt 49.9 kg   SpO2 92%   BMI 18.88 kg/m  SpO2: SpO2: 92 % O2 Device: O2 Device: Room Air O2 Flow Rate:    Intake/output summary:  Intake/Output Summary (Last 24 hours) at 12/06/2020 1314 Last data filed at 12/06/2020 1100 Gross per 24 hour  Intake 1858.89 ml  Output 300 ml  Net 1558.89 ml    LBM: Last BM Date: 12/05/20 Baseline Weight: Weight: 49.9 kg Most recent  weight: Weight: 49.9 kg       Palliative Assessment/Data:      Patient Active Problem List   Diagnosis Date Noted   Palliative care by specialist    General weakness    Hypernatremia 11/30/2020   Fever 11/29/2020   Fall 11/29/2020   Pneumonia 11/28/2020   Dysphagia 11/28/2020   Fall at home, initial encounter 11/27/2020   Abnormal CT  scan 11/27/2020   Infection of great toe 11/26/2020   Acute encephalopathy 11/26/2020   Dementia (HCC) 11/26/2020   GAD (generalized anxiety disorder) 04/21/2020   Goals of care, counseling/discussion 02/16/2020   Deficiency anemia 02/13/2020   Cough with sputum 02/13/2020   Inguinal hernia of left side without obstruction or gangrene 02/13/2020   BPH (benign prostatic hypertrophy) 03/19/2014   Routine general medical examination at a health care facility 03/19/2014    Palliative Care Assessment & Plan   Patient Profile:    Assessment:  87 yrs old male with PMH significant of BPH, dementia who presented with s/p mechanical fall.  He was noted to have ingrown toenail on L first toe with erythema of first toe and was started on abx with podiatry consult.  Podiatry recommending abx only and with low suspicion for osteomyelitis. Hospitalization complicated by delirium and aspiration pneumonia.  Palliative care following for GOC.   Recommendations/Plan:  Continue the trial of comfort feeds: note around 25% meal intake.  See if the patient is able to participate with PT OT: Will recommend skilled nursing facility for rehabilitation attempt with palliative services following if the patient is able to have some p.o. intake and if he is able to participate to some extent with PT/OT.  Will complete antibiotics soon. PMT to follow up on 12-07-20 as well.     Code Status:    Code Status Orders  (From admission, onward)           Start     Ordered   11/28/20 1133  Do not attempt resuscitation (DNR)  Continuous       Question Answer Comment   In the event of cardiac or respiratory ARREST Do not call a "code blue"   In the event of cardiac or respiratory ARREST Do not perform Intubation, CPR, defibrillation or ACLS   In the event of cardiac or respiratory ARREST Use medication by any route, position, wound care, and other measures to relive pain and suffering. May use oxygen, suction and manual treatment of airway obstruction as needed for comfort.      11/28/20 1132           Code Status History     Date Active Date Inactive Code Status Order ID Comments User Context   11/26/2020 2353 11/28/2020 1132 Full Code [id number]  Hillary Bow, DO ED       Prognosis:  Unable to determine  Discharge Planning: To Be Determined  Care plan was discussed with IDT, bedside RN.    Thank you for allowing the Palliative Medicine Team to assist in the care of this patient.   Time In: 11 Time Out: 11.25 Total Time 25 Prolonged Time Billed  no       Greater than 50%  of this time was spent counseling and coordinating care related to the above assessment and plan.  Rosalin Hawking, MD  Please contact Palliative Medicine Team phone at [phone] for questions and concerns.

## 2020-12-06 NOTE — Care Management Important Message (Signed)
Important Message  Patient Details IM Letter placed in Patients room. Name: Christopher Gross MRN: [id number] Date of Birth: Dec 09, 1924   Medicare Important Message Given:  Yes     Caren Macadam 12/06/2020, 10:54 AM

## 2020-12-06 NOTE — Progress Notes (Signed)
Nutrition Follow-up  INTERVENTION:   -Ensure Enlive po TID, each supplement provides 350 kcal and 20 grams of protein   -MVI with minerals daily  -Feeding assistance with meals  NUTRITION DIAGNOSIS:   Inadequate oral intake related to decreased appetite as evidenced by meal completion < 25%.  Ongoing.  GOAL:   Patient will meet greater than or equal to 90% of their needs  Not meeting. Comfort feeds  MONITOR:   PO intake, Supplement acceptance, Labs, Weight trends, Skin, I & O's  ASSESSMENT:   Christopher Gross is a 87 y.o. male with medical history significant of BPH, dementia. Pt admitted with great toe infection.   SLP recommending comfort feeds as of 11/22. Pt is being followed by Palliative care.  Pt refused to eat this morning, the same was documented for dinner last night as well. Will continue supplements as ordered.  Admission weight: 110 lbs.  No other weights this admission.  Medications: Multivitamin with minerals daily, KLOR-CON, D5 infusion  Labs reviewed:  Low K  Diet Order:   Diet Order             DIET DYS 3 Room service appropriate? Yes; Fluid consistency: Thin  Diet effective now                   EDUCATION NEEDS:   No education needs have been identified at this time  Skin:  Skin Assessment: Skin Integrity Issues: Skin Integrity Issues:: Other (Comment) Other: MASD mid perineum  Last BM:  11/27 -type 6  Height:   Ht Readings from Last 1 Encounters:  11/26/20 5\' 4"  (1.626 m)    Weight:   Wt Readings from Last 1 Encounters:  11/26/20 49.9 kg   BMI:  Body mass index is 18.88 kg/m.  Estimated Nutritional Needs:   Kcal:  1550-1750  Protein:  75-90 grams  Fluid:  > 1.5 L  11/28/20, MS, RD, LDN Inpatient Clinical Dietitian Contact information available via Amion

## 2020-12-06 NOTE — Progress Notes (Signed)
PROGRESS NOTE    Christopher Gross  [id number] DOB: 07-Nov-1924 DOA: 11/26/2020 PCP: Janith Lima, MD   Brief Narrative:  This 87 yrs old male with PMH significant of BPH, dementia who presented with s/p mechanical fall.  He was noted to have ingrown toenail on L first toe with erythema of first toe and was started on abx with podiatry consult.  Podiatry recommending abx only and with low suspicion for osteomyelitis. Hospitalization complicated by delirium and aspiration pneumonia. Palliative care is following, now planning for comfort feeds, will see how he does on abx next day or so and continue further goc discussions.    Assessment & Plan:   Principal Problem:   Fall at home, initial encounter Active Problems:   Goals of care, counseling/discussion   Infection of great toe   Acute encephalopathy   Dementia (HCC)   Abnormal CT scan   Pneumonia   Dysphagia   Fever   Fall   Hypernatremia   Palliative care by specialist   General weakness  Fall at home, Initial encounter: CT head/neck without any acute findings. PT and OT eval once he is more cooperative. Patient was unable to follow any basic commands for PT eval.  Goals of care discussion:  Appreciate palliative care assistance, recommended comfort feeds with precaution.   Follow course for next few days, then make further decisions regarding SNF with palliative vs hospice.  Pneumonia: CXR concerning for pneumonia with bibasilar infiltrates with pneumonia or aspiration. Continue ceftriaxone, added flagyl for possible Aspiration. Urine culture : No growth and Blood culture NGTD Speech eval recommended comfort feeds. Completed antibiotic course for 7 days( last date 11/27)  Dementia:  Stable.  Continue delirium precautions   Acute encephalopathy : Likely secondary to acute hospitalization, dementia, infection. Continue low-dose Ativan as needed. Continue delirium precautions. Continue soft mittens for safety.    Dysphagia: Speech eval. Recommending comfort feeds.  Ingrown toenail left foot: MRI not diagnostic.  CRP 5.6, Sed rate 57. Discussed with podiatry, recommended Keflex, mupirocin ointment and dressing changes around the nail. Completed ceftriaxone and Flagyl for pneumonia.   Hypernatremia : > Improved. This could be secondary to dehydration, hypotonic fluids.   Sodium improved.  Hypokalemia: Replaced . Continue to monitor.  Abnormal CT scan: Spiculated density of visualized right upper lobe, will discuss with family --- at this time, can hold off on further workup.    DVT prophylaxis: Lovenox Code Status:DNR Family Communication: No family at bedside. Disposition Plan:   Status is: Inpatient  Remains inpatient appropriate because: Requiring IV antibiotics.  Delirium in setting of dementia.  Pending   PT and OT evaluation and possible SNF.  Consultants:  Podiatry Palliative care  Procedures: None.  Antimicrobials:  Anti-infectives (From admission, onward)    Start     Dose/Rate Route Frequency Ordered Stop   11/29/20 1315  metroNIDAZOLE (FLAGYL) IVPB 500 mg  Status:  Discontinued        500 mg 100 mL/hr over 60 Minutes Intravenous Every 12 hours 11/29/20 1222 12/06/20 0859   11/28/20 1800  cefTRIAXone (ROCEPHIN) 1 g in sodium chloride 0.9 % 100 mL IVPB  Status:  Discontinued        1 g 200 mL/hr over 30 Minutes Intravenous Every 24 hours 11/28/20 1649 12/06/20 0859   11/28/20 1215  ceFAZolin (ANCEF) IVPB 1 g/50 mL premix  Status:  Discontinued        1 g 100 mL/hr over 30 Minutes Intravenous Every 12 hours 11/28/20  1125 11/28/20 1649   11/27/20 1000  cefTRIAXone (ROCEPHIN) 2 g in sodium chloride 0.9 % 100 mL IVPB  Status:  Discontinued       See Hyperspace for full Linked Orders Report.   2 g 200 mL/hr over 30 Minutes Intravenous Every 24 hours 11/26/20 2349 11/28/20 1122   11/27/20 1000  metroNIDAZOLE (FLAGYL) IVPB 500 mg  Status:  Discontinued        500  mg 100 mL/hr over 60 Minutes Intravenous Every 12 hours 11/27/20 0048 11/28/20 1122   11/27/20 0000  metroNIDAZOLE (FLAGYL) tablet 500 mg  Status:  Discontinued       See Hyperspace for full Linked Orders Report.   500 mg Oral Every 12 hours 11/26/20 2349 11/27/20 0048   11/26/20 2345  piperacillin-tazobactam (ZOSYN) IVPB 3.375 g        3.375 g 100 mL/hr over 30 Minutes Intravenous  Once 11/26/20 2335 11/27/20 0013        Subjective: Patient was seen and examined at bedside.  Overnight events noted.   Patient is lying comfortably in the bed.  He is alert and arousable, following commands today. Patient remains in mittens otherwise comfortable and calmer.  Objective: Vitals:   12/05/20 0640 12/05/20 2041 12/06/20 0449 12/06/20 1019  BP: (!) 102/46 (!) 147/66 (!) 150/63 (!) 142/60  Pulse: 86 78 77 81  Resp: 17  15 14   Temp: 97.9 F (36.6 C) 98.4 F (36.9 C) (!) 97.4 F (36.3 C) (!) 97.5 F (36.4 C)  TempSrc: Oral Oral Oral Axillary  SpO2: 90% 91% 92% 92%  Weight:      Height:        Intake/Output Summary (Last 24 hours) at 12/06/2020 1318 Last data filed at 12/06/2020 1100 Gross per 24 hour  Intake 1858.89 ml  Output 300 ml  Net 1558.89 ml   Filed Weights   11/26/20 2153  Weight: 49.9 kg    Examination:  General exam: Appears comfortable, deconditioned, NAD, chronically ill looking. Respiratory system: CTA bilaterally.  Respiratory effort normal.  RR 15 Cardiovascular system: S1-S2 heard, regular rate and rhythm, no murmur. Gastrointestinal system: Abdomen is soft, nontender, nondistended, BS+ Central nervous system: Alert and oriented X 1.  No focal neurological deficits. Extremities: No edema, no cyanosis, no clubbing. Skin: No rashes, lesions or ulcers Psychiatry: Mood & affect appropriate.     Data Reviewed: I have personally reviewed following labs and imaging studies  CBC: Recent Labs  Lab 11/30/20 0501 12/02/20 0538 12/03/20 0528 12/05/20 0541   WBC 7.4 13.8* 9.3 9.2  HGB 12.7* 12.0* 11.2* 10.0*  HCT 41.0 36.9* 35.0* 30.8*  MCV 97.6 92.3 92.3 91.9  PLT 276 291 289 [id number]   Basic Metabolic Panel: Recent Labs  Lab 11/30/20 0501 12/02/20 0538 12/03/20 0528 12/05/20 0541 12/06/20 0524  NA 146* 142 140 136 135  K 3.3* 2.8* 3.7 2.8* 3.0*  CL 110 108 107 104 103  CO2 21* 24 27 24 24   GLUCOSE 91 107* 110* 121* 101*  BUN 31* 23 21 20 17   CREATININE 1.16 1.04 0.97 0.95 1.04  CALCIUM 9.2 9.0 9.1 8.7* 8.6*  MG  --  1.8 1.9 1.9  --   PHOS  --  2.4* 2.6 2.6  --    GFR: Estimated Creatinine Clearance: 30.7 mL/min (by C-G formula based on SCr of 1.04 mg/dL). Liver Function Tests: No results for input(s): AST, ALT, ALKPHOS, BILITOT, PROT, ALBUMIN in the last 168 hours.  No results for  input(s): LIPASE, AMYLASE in the last 168 hours. No results for input(s): AMMONIA in the last 168 hours. Coagulation Profile: No results for input(s): INR, PROTIME in the last 168 hours. Cardiac Enzymes: No results for input(s): CKTOTAL, CKMB, CKMBINDEX, TROPONINI in the last 168 hours. BNP (last 3 results) No results for input(s): PROBNP in the last 8760 hours. HbA1C: No results for input(s): HGBA1C in the last 72 hours. CBG: No results for input(s): GLUCAP in the last 168 hours. Lipid Profile: No results for input(s): CHOL, HDL, LDLCALC, TRIG, CHOLHDL, LDLDIRECT in the last 72 hours. Thyroid Function Tests: No results for input(s): TSH, T4TOTAL, FREET4, T3FREE, THYROIDAB in the last 72 hours. Anemia Panel: No results for input(s): VITAMINB12, FOLATE, FERRITIN, TIBC, IRON, RETICCTPCT in the last 72 hours. Sepsis Labs: Recent Labs  Lab 12/03/20 1256  PROCALCITON 0.23    Recent Results (from the past 240 hour(s))  MRSA Next Gen by PCR, Nasal     Status: None   Collection Time: 11/26/20 11:51 PM   Specimen: Nasal Mucosa; Nasal Swab  Result Value Ref Range Status   MRSA by PCR Next Gen NOT DETECTED NOT DETECTED Final    Comment:  (NOTE) The GeneXpert MRSA Assay (FDA approved for NASAL specimens only), is one component of a comprehensive MRSA colonization surveillance program. It is not intended to diagnose MRSA infection nor to guide or monitor treatment for MRSA infections. Test performance is not FDA approved in patients less than 85 years old. Performed at [facility], [place] [address], [place], [place] [address]   Resp Panel by RT-PCR (Flu A&B, Covid) Nasopharyngeal Swab     Status: None   Collection Time: 11/27/20  1:31 AM   Specimen: Nasopharyngeal Swab; Nasopharyngeal(NP) swabs in vial transport medium  Result Value Ref Range Status   SARS Coronavirus 2 by RT PCR NEGATIVE NEGATIVE Final    Comment: (NOTE) SARS-CoV-2 target nucleic acids are NOT DETECTED.  The SARS-CoV-2 RNA is generally detectable in upper respiratory specimens during the acute phase of infection. The lowest concentration of SARS-CoV-2 viral copies this assay can detect is 138 copies/mL. A negative result does not preclude SARS-Cov-2 infection and should not be used as the sole basis for treatment or other patient management decisions. A negative result may occur with  improper specimen collection/handling, submission of specimen other than nasopharyngeal swab, presence of viral mutation(s) within the areas targeted by this assay, and inadequate number of viral copies(<138 copies/mL). A negative result must be combined with clinical observations, patient history, and epidemiological information. The expected result is Negative.  Fact Sheet for Patients:  EntrepreneurPulse.com.au  Fact Sheet for Healthcare Providers:  IncredibleEmployment.be  This test is no t yet approved or cleared by the [place] FDA and  has been authorized for detection and/or diagnosis of SARS-CoV-2 by FDA under an Emergency Use Authorization (EUA). This EUA will remain  in effect (meaning this test  can be used) for the duration of the COVID-19 declaration under Section 564(b)(1) of the Act, 21 U.S.C.section 360bbb-3(b)(1), unless the authorization is terminated  or revoked sooner.       Influenza A by PCR NEGATIVE NEGATIVE Final   Influenza B by PCR NEGATIVE NEGATIVE Final    Comment: (NOTE) The Xpert Xpress SARS-CoV-2/FLU/RSV plus assay is intended as an aid in the diagnosis of influenza from Nasopharyngeal swab specimens and should not be used as a sole basis for treatment. Nasal washings and aspirates are unacceptable for Xpert Xpress SARS-CoV-2/FLU/RSV testing.  Fact Sheet for Patients: BloggerCourse.com  Fact Sheet for Healthcare Providers: SeriousBroker.it  This test is not yet approved or cleared by the [place] FDA and has been authorized for detection and/or diagnosis of SARS-CoV-2 by FDA under an Emergency Use Authorization (EUA). This EUA will remain in effect (meaning this test can be used) for the duration of the COVID-19 declaration under Section 564(b)(1) of the Act, 21 U.S.C. section 360bbb-3(b)(1), unless the authorization is terminated or revoked.  Performed at [facility], [address] [place], [place] [address]   Urine Culture     Status: None   Collection Time: 11/29/20  8:40 AM   Specimen: Urine, Clean Catch  Result Value Ref Range Status   Specimen Description   Final    URINE, CLEAN CATCH Performed at [facility], [address] [place], [place] [address]    Special Requests   Final    NONE Performed at [facility], [address], [place], [place] [address]    Culture   Final    NO GROWTH Performed at [facility], [address], [place], [place] [address]    Report Status 11/30/2020 FINAL  Final  Culture, blood (routine x 2)     Status: None   Collection Time: 11/29/20  8:43 AM   Specimen: BLOOD  Result Value  Ref Range Status   Specimen Description   Final    BLOOD RIGHT ARM Performed at [facility], [address], [place], [place] [address]    Special Requests   Final    BOTTLES DRAWN AEROBIC AND ANAEROBIC Blood Culture adequate volume Performed at [facility], [address] [place], [place] [address]    Culture   Final    NO GROWTH 5 DAYS Performed at [facility], [address] [place], [place] [address]    Report Status 12/04/2020 FINAL  Final  Culture, blood (routine x 2)     Status: None   Collection Time: 11/29/20  8:49 AM   Specimen: BLOOD  Result Value Ref Range Status   Specimen Description   Final    BLOOD LEFT ANTECUBITAL Performed at [facility], [address], [place], [place] [address]    Special Requests   Final    BOTTLES DRAWN AEROBIC AND ANAEROBIC Blood Culture adequate volume Performed at [facility], [address], [place], [place] [address]    Culture   Final    NO GROWTH 5 DAYS Performed at [facility], [address], [place], [place] [address]    Report Status 12/04/2020 FINAL  Final    Radiology Studies: No results found.  Scheduled Meds:  enoxaparin (LOVENOX) injection  30 mg Subcutaneous Q24H   feeding supplement  237 mL Oral TID BM   multivitamin with minerals  1 tablet Oral Daily   mupirocin ointment   Topical BID   Continuous Infusions:  dextrose 50 mL/hr at 12/06/20 0622   potassium chloride 10 mEq (12/06/20 1251)     LOS: 7 days    Time spent: 25 mins    Natonya Finstad, MD [facility]   If 7PM-7AM, please contact night-coverage

## 2020-12-06 NOTE — Progress Notes (Signed)
Speech Language Pathology Treatment: Dysphagia  Patient Details Name: Christopher Gross MRN: [id number] DOB: 1924/09/10 Today's Date: 12/06/2020 Time: 1205-1225 SLP Time Calculation (min) (ACUTE ONLY): 20 min  Assessment / Plan / Recommendation Clinical Impression  Patient seen to address dysphagia goals. SLP spoke with patient's NT who stated that patient did eat a few bites of applesauce and a few sips of liquids this AM but overall his PO intake has been very poor. When SLP entered room, patient lying on side with legs pulled in close to body. Although he did request for help stretching his legs, he did not allow this and when SLP gently pulled on legs, they were very tight and unable to adequately and safely extend. NT helped SLP position patient into adequate elevated side lying position. He accepted several straw sips of thin liquids (strawberry Ensure) and exhibited mild amount of delayed coughing but no change in vocal quality. After drinking he said "that's good" but he declined any further PO's. Patient is very confused, fidgeting with hands and grabbing blankets, etc. Unfortunately, patient is not able to consume enough PO's at this time and suspect progress to be very slow if any is to happen regarding his PO intake. SLP will attempt to follow at least one more time with plan to further educate wife and assist in GOC decisions.    HPI HPI: Patient is a 87 y.o. male with PMH: dementia, BPH who presented to ED for mechanical fall at home. He hit head, had skin tear to left elbow and unknown LOC. Patient has also been having left toe pain and family concerned about infection from ingrown toenail. In ED, patient became more confused requiring restraints and ativan to prevent from falling out of bed. CXR was concerning for PNA with bibasilar infiltrates. CT head and neck without acute finding.      SLP Plan  Continue with current plan of care      Recommendations for follow up therapy are one  component of a multi-disciplinary discharge planning process, led by the attending physician.  Recommendations may be updated based on patient status, additional functional criteria and insurance authorization.    Recommendations  Diet recommendations: Other(comment) (continue to recommend comfort PO's) Liquids provided via: Cup;Straw Medication Administration: Other (Comment) (crushed with puree or via IV) Supervision: Full supervision/cueing for compensatory strategies;Staff to assist with self feeding Postural Changes and/or Swallow Maneuvers: Seated upright 90 degrees;Upright 30-60 min after meal                Oral Care Recommendations: Oral care QID;Staff/trained caregiver to provide oral care Follow Up Recommendations: No SLP follow up Assistance recommended at discharge: Frequent or constant Supervision/Assistance SLP Visit Diagnosis: Dysphagia, unspecified (R13.10) Plan: Continue with current plan of care       Angela Nevin, MA, CCC-SLP Speech Therapy

## 2020-12-07 LAB — BASIC METABOLIC PANEL
Anion gap: 5 (ref 5–15)
BUN: 16 mg/dL (ref 8–23)
CO2: 24 mmol/L (ref 22–32)
Calcium: 8.7 mg/dL — ABNORMAL LOW (ref 8.9–10.3)
Chloride: 102 mmol/L (ref 98–111)
Creatinine, Ser: 1.02 mg/dL (ref 0.61–1.24)
GFR, Estimated: >60 mL/min (ref >60)
Glucose, Bld: 114 mg/dL — ABNORMAL HIGH (ref 70–99)
Potassium: 3.2 mmol/L — ABNORMAL LOW (ref 3.5–5.1)
Sodium: 131 mmol/L — ABNORMAL LOW (ref 135–145)

## 2020-12-07 MED ORDER — POTASSIUM CHLORIDE 20 MEQ PO PACK
PACK | ORAL | Status: AC
  Filled 2020-12-07: qty 2

## 2020-12-07 MED ORDER — POTASSIUM CHLORIDE 20 MEQ PO PACK
40.0000 meq | PACK | Freq: Once | ORAL | Status: AC
  Administered 2020-12-07: 40 meq via ORAL

## 2020-12-07 NOTE — Progress Notes (Signed)
PROGRESS NOTE    MINDY VERZOSA  [id number] DOB: Aug 17, 1924 DOA: 11/26/2020 PCP: Janith Lima, MD   Brief Narrative:  This 87 yrs old male with PMH significant of BPH, dementia who presented with s/p mechanical fall.  He was noted to have ingrown toenail on L first toe with erythema of first toe and was started on abx with podiatry consult.  Podiatry recommending abx only and with low suspicion for osteomyelitis. Hospitalization complicated by delirium and aspiration pneumonia. Palliative care is following, now planning for comfort feeds, will see how he does on abx next day or so and continue further goc discussions.    Assessment & Plan:   Principal Problem:   Fall at home, initial encounter Active Problems:   Goals of care, counseling/discussion   Infection of great toe   Acute encephalopathy   Dementia (HCC)   Abnormal CT scan   Pneumonia   Dysphagia   Fever   Fall   Hypernatremia   Palliative care by specialist   General weakness  Fall at home, Initial encounter: CT head/neck without any acute findings. PT and OT eval once he is more cooperative. Patient was unable to follow any basic commands for PT eval. 11/26  Goals of care discussion:  Appreciate palliative care assistance, recommended comfort feeds with precaution.   Follow course for next few days, then make further decisions regarding SNF with palliative vs hospice.  Pneumonia: CXR concerning for pneumonia with bibasilar infiltrates with pneumonia or aspiration. Continued ceftriaxone, added flagyl for possible Aspiration. Urine culture : No growth and Blood culture NGTD Speech eval recommended comfort feeds. Completed antibiotic course for 7 days( last date 11/27)  Dementia:  Stable.  Continue delirium precautions   Acute encephalopathy : Likely secondary to acute hospitalization, dementia, infection. Continue low-dose Ativan as needed. Continue delirium precautions. Continue soft mittens for  safety.   Dysphagia: Speech eval. Recommending comfort feeds.  Ingrown toenail left foot: MRI not diagnostic.  CRP 5.6, Sed rate 57. Discussed with podiatry, recommended Keflex, mupirocin ointment and dressing changes around the nail. Completed ceftriaxone and Flagyl for pneumonia.   Hypernatremia : > Improved. This could be secondary to dehydration, hypotonic fluids.   Sodium improved.  Hypokalemia: Replaced . Continue to monitor.  Abnormal CT scan: Spiculated density of visualized right upper lobe, will discuss with family --- at this time, can hold off on further workup.    DVT prophylaxis: Lovenox Code Status:DNR Family Communication: No family at bedside. Disposition Plan:   Status is: Inpatient  Remains inpatient appropriate because: Requiring IV antibiotics.  Delirium in setting of dementia.  Pending   PT and OT evaluation and possible SNF.  Consultants:  Podiatry Palliative care  Procedures: None.  Antimicrobials:  Anti-infectives (From admission, onward)    Start     Dose/Rate Route Frequency Ordered Stop   11/29/20 1315  metroNIDAZOLE (FLAGYL) IVPB 500 mg  Status:  Discontinued        500 mg 100 mL/hr over 60 Minutes Intravenous Every 12 hours 11/29/20 1222 12/06/20 0859   11/28/20 1800  cefTRIAXone (ROCEPHIN) 1 g in sodium chloride 0.9 % 100 mL IVPB  Status:  Discontinued        1 g 200 mL/hr over 30 Minutes Intravenous Every 24 hours 11/28/20 1649 12/06/20 0859   11/28/20 1215  ceFAZolin (ANCEF) IVPB 1 g/50 mL premix  Status:  Discontinued        1 g 100 mL/hr over 30 Minutes Intravenous Every 12 hours  11/28/20 1125 11/28/20 1649   11/27/20 1000  cefTRIAXone (ROCEPHIN) 2 g in sodium chloride 0.9 % 100 mL IVPB  Status:  Discontinued       See Hyperspace for full Linked Orders Report.   2 g 200 mL/hr over 30 Minutes Intravenous Every 24 hours 11/26/20 2349 11/28/20 1122   11/27/20 1000  metroNIDAZOLE (FLAGYL) IVPB 500 mg  Status:  Discontinued         500 mg 100 mL/hr over 60 Minutes Intravenous Every 12 hours 11/27/20 0048 11/28/20 1122   11/27/20 0000  metroNIDAZOLE (FLAGYL) tablet 500 mg  Status:  Discontinued       See Hyperspace for full Linked Orders Report.   500 mg Oral Every 12 hours 11/26/20 2349 11/27/20 0048   11/26/20 2345  piperacillin-tazobactam (ZOSYN) IVPB 3.375 g        3.375 g 100 mL/hr over 30 Minutes Intravenous  Once 11/26/20 2335 11/27/20 0013        Subjective: Patient was seen and examined at bedside.  Overnight events noted.   Patient is lying comfortably in the bed.  He is alert, arousable, following partial commands. Patient remains in mittens otherwise comfortable and calmer.  Objective: Vitals:   12/06/20 0449 12/06/20 1019 12/06/20 2222 12/07/20 0615  BP: (!) 150/63 (!) 142/60 (!) 137/55 (!) 123/47  Pulse: 77 81 85 80  Resp: 15 14 20 16   Temp: (!) 97.4 F (36.3 C) (!) 97.5 F (36.4 C) 98.2 F (36.8 C) 97.8 F (36.6 C)  TempSrc: Oral Axillary Oral Oral  SpO2: 92% 92% 95% 95%  Weight:      Height:        Intake/Output Summary (Last 24 hours) at 12/07/2020 1346 Last data filed at 12/07/2020 1300 Gross per 24 hour  Intake 117 ml  Output 1500 ml  Net -1383 ml   Filed Weights   11/26/20 2153  Weight: 49.9 kg    Examination:  General exam: Chronically ill looking, comfortable, deconditioned, NAD Respiratory system: CTA bilaterally.  Respiratory effort normal.  RR 15 Cardiovascular system: S1-S2 heard, regular rate and rhythm, no murmur. Gastrointestinal system: Abdomen is soft, nontender, nondistended, BS+ Central nervous system: Alert and oriented X 1.  No focal neurological deficits. Extremities: No edema, no cyanosis, no clubbing. Skin: No rashes, lesions or ulcers Psychiatry: Mood & affect appropriate.     Data Reviewed: I have personally reviewed following labs and imaging studies  CBC: Recent Labs  Lab 12/02/20 0538 12/03/20 0528 12/05/20 0541  WBC 13.8* 9.3 9.2  HGB  12.0* 11.2* 10.0*  HCT 36.9* 35.0* 30.8*  MCV 92.3 92.3 91.9  PLT 291 289 [id number]   Basic Metabolic Panel: Recent Labs  Lab 12/02/20 0538 12/03/20 0528 12/05/20 0541 12/06/20 0524 12/07/20 0535  NA 142 140 136 135 131*  K 2.8* 3.7 2.8* 3.0* 3.2*  CL 108 107 104 103 102  CO2 24 27 24 24 24   GLUCOSE 107* 110* 121* 101* 114*  BUN 23 21 20 17 16   CREATININE 1.04 0.97 0.95 1.04 1.02  CALCIUM 9.0 9.1 8.7* 8.6* 8.7*  MG 1.8 1.9 1.9  --   --   PHOS 2.4* 2.6 2.6  --   --    GFR: Estimated Creatinine Clearance: 31.3 mL/min (by C-G formula based on SCr of 1.02 mg/dL). Liver Function Tests: No results for input(s): AST, ALT, ALKPHOS, BILITOT, PROT, ALBUMIN in the last 168 hours.  No results for input(s): LIPASE, AMYLASE in the last 168 hours.  No results for input(s): AMMONIA in the last 168 hours. Coagulation Profile: No results for input(s): INR, PROTIME in the last 168 hours. Cardiac Enzymes: No results for input(s): CKTOTAL, CKMB, CKMBINDEX, TROPONINI in the last 168 hours. BNP (last 3 results) No results for input(s): PROBNP in the last 8760 hours. HbA1C: No results for input(s): HGBA1C in the last 72 hours. CBG: No results for input(s): GLUCAP in the last 168 hours. Lipid Profile: No results for input(s): CHOL, HDL, LDLCALC, TRIG, CHOLHDL, LDLDIRECT in the last 72 hours. Thyroid Function Tests: No results for input(s): TSH, T4TOTAL, FREET4, T3FREE, THYROIDAB in the last 72 hours. Anemia Panel: No results for input(s): VITAMINB12, FOLATE, FERRITIN, TIBC, IRON, RETICCTPCT in the last 72 hours. Sepsis Labs: Recent Labs  Lab 12/03/20 1256  PROCALCITON 0.23    Recent Results (from the past 240 hour(s))  Urine Culture     Status: None   Collection Time: 11/29/20  8:40 AM   Specimen: Urine, Clean Catch  Result Value Ref Range Status   Specimen Description   Final    URINE, CLEAN CATCH Performed at [facility], [address], [place], [place] [address]     Special Requests   Final    NONE Performed at [facility], [address] [place], [place] [address]    Culture   Final    NO GROWTH Performed at [facility], [address] [place], [place] [address]    Report Status 11/30/2020 FINAL  Final  Culture, blood (routine x 2)     Status: None   Collection Time: 11/29/20  8:43 AM   Specimen: BLOOD  Result Value Ref Range Status   Specimen Description   Final    BLOOD RIGHT ARM Performed at [facility], [address] [place], [place] [address]    Special Requests   Final    BOTTLES DRAWN AEROBIC AND ANAEROBIC Blood Culture adequate volume Performed at [facility], [address], [place], [place] [address]    Culture   Final    NO GROWTH 5 DAYS Performed at [facility], [address] [place], [place] [address]    Report Status 12/04/2020 FINAL  Final  Culture, blood (routine x 2)     Status: None   Collection Time: 11/29/20  8:49 AM   Specimen: BLOOD  Result Value Ref Range Status   Specimen Description   Final    BLOOD LEFT ANTECUBITAL Performed at [facility], [address] [place], [place] [address]    Special Requests   Final    BOTTLES DRAWN AEROBIC AND ANAEROBIC Blood Culture adequate volume Performed at [facility], [address] [place], [place] [address]    Culture   Final    NO GROWTH 5 DAYS Performed at [facility], [address], [place], [place] [address]    Report Status 12/04/2020 FINAL  Final    Radiology Studies: No results found.  Scheduled Meds:  enoxaparin (LOVENOX) injection  30 mg Subcutaneous Q24H   feeding supplement  237 mL Oral TID BM   multivitamin with minerals  1 tablet Oral Daily   mupirocin ointment   Topical BID   Continuous Infusions:  dextrose 50 mL/hr at 12/06/20 0622     LOS: 8 days    Time spent: 25 mins    Jadyn Barge, MD [facility]   If 7PM-7AM, please contact night-coverage

## 2020-12-07 NOTE — Progress Notes (Signed)
Daily Progress Note   Patient Name: Christopher Gross       Date: 12/07/2020 DOB: 1924/12/30  Age: 87 y.o. MRN#: [id number] Attending Physician: Cipriano Bunker, MD Primary Care Physician: Etta Grandchild, MD Admit Date: 11/26/2020  Reason for Consultation/Follow-up: Establishing goals of care  Subjective: Curled up in bed at time of exam.  Answers a few simple questions but unable to participate in complex conversation.  I called and spoke with his wife and we discussed decline in his nutrition, cognition, and functional status.  She is hopeful for trial of rehab for him but we discussed concern about his complicated course this hospitalization and sleepy participate in rehab.   Length of Stay: 8  Current Medications: Scheduled Meds:   enoxaparin (LOVENOX) injection  30 mg Subcutaneous Q24H   feeding supplement  237 mL Oral TID BM   multivitamin with minerals  1 tablet Oral Daily   mupirocin ointment   Topical BID   potassium chloride        Continuous Infusions:  dextrose 50 mL/hr at 12/06/20 0622    PRN Meds: acetaminophen **OR** acetaminophen, LORazepam, ondansetron **OR** ondansetron (ZOFRAN) IV  Physical Exam           Awake today No distress Regular work of breathing S 1 S 2  Abdomen is not distended Wearing mittens  Vital Signs: BP (!) 123/47 (BP Location: Left Leg)   Pulse 80   Temp 97.8 F (36.6 C) (Oral)   Resp 16   Ht 5\' 4"  (1.626 m)   Wt 49.9 kg   SpO2 95%   BMI 18.88 kg/m  SpO2: SpO2: 95 % O2 Device: O2 Device: Room Air O2 Flow Rate:    Intake/output summary:  Intake/Output Summary (Last 24 hours) at 12/07/2020 2025 Last data filed at 12/07/2020 1300 Gross per 24 hour  Intake 117 ml  Output 1200 ml  Net -1083 ml    LBM: Last BM Date:  12/05/20 Baseline Weight: Weight: 49.9 kg Most recent weight: Weight: 49.9 kg       Palliative Assessment/Data:      Patient Active Problem List   Diagnosis Date Noted   Palliative care by specialist    General weakness    Hypernatremia 11/30/2020   Fever 11/29/2020   Fall 11/29/2020   Pneumonia 11/28/2020  Dysphagia 11/28/2020   Fall at home, initial encounter 11/27/2020   Abnormal CT scan 11/27/2020   Infection of great toe 11/26/2020   Acute encephalopathy 11/26/2020   Dementia (HCC) 11/26/2020   GAD (generalized anxiety disorder) 04/21/2020   Goals of care, counseling/discussion 02/16/2020   Deficiency anemia 02/13/2020   Cough with sputum 02/13/2020   Inguinal hernia of left side without obstruction or gangrene 02/13/2020   BPH (benign prostatic hypertrophy) 03/19/2014   Routine general medical examination at a health care facility 03/19/2014    Palliative Care Assessment & Plan   Patient Profile:    Assessment:  87 yrs old male with PMH significant of BPH, dementia who presented with s/p mechanical fall.  He was noted to have ingrown toenail on L first toe with erythema of first toe and was started on abx with podiatry consult.  Podiatry recommending abx only and with low suspicion for osteomyelitis. Hospitalization complicated by delirium and aspiration pneumonia.  Palliative care following for GOC.   Recommendations/Plan: -Continue close monitoring of intake and ability to participate in therapy. -Wife preference would be for trial of therapy if possible.  Code Status:    Code Status Orders  (From admission, onward)           Start     Ordered   11/28/20 1133  Do not attempt resuscitation (DNR)  Continuous       Question Answer Comment  In the event of cardiac or respiratory ARREST Do not call a "code blue"   In the event of cardiac or respiratory ARREST Do not perform Intubation, CPR, defibrillation or ACLS   In the event of cardiac or  respiratory ARREST Use medication by any route, position, wound care, and other measures to relive pain and suffering. May use oxygen, suction and manual treatment of airway obstruction as needed for comfort.      11/28/20 1132           Code Status History     Date Active Date Inactive Code Status Order ID Comments User Context   11/26/2020 2353 11/28/2020 1132 Full Code [id number]  Hillary Bow, DO ED       Prognosis:  Unable to determine  Discharge Planning: To Be Determined  Care plan was discussed with IDT, bedside RN.    Thank you for allowing the Palliative Medicine Team to assist in the care of this patient.   Total Time 25 Prolonged Time Billed  no    Greater than 50%  of this time was spent counseling and coordinating care related to the above assessment and plan.  Romie Minus, MD  Please contact Palliative Medicine Team phone at [phone] for questions and concerns.

## 2020-12-08 LAB — BASIC METABOLIC PANEL
Anion gap: 8 (ref 5–15)
BUN: 17 mg/dL (ref 8–23)
CO2: 23 mmol/L (ref 22–32)
Calcium: 8.6 mg/dL — ABNORMAL LOW (ref 8.9–10.3)
Chloride: 101 mmol/L (ref 98–111)
Creatinine, Ser: 1.07 mg/dL (ref 0.61–1.24)
GFR, Estimated: >60 mL/min (ref >60)
Glucose, Bld: 124 mg/dL — ABNORMAL HIGH (ref 70–99)
Potassium: 3.9 mmol/L (ref 3.5–5.1)
Sodium: 132 mmol/L — ABNORMAL LOW (ref 135–145)

## 2020-12-08 LAB — CBC
HCT: 31 % — ABNORMAL LOW (ref 39.0–52.0)
Hemoglobin: 10.4 g/dL — ABNORMAL LOW (ref 13.0–17.0)
MCH: 29.9 pg (ref 26.0–34.0)
MCHC: 33.5 g/dL (ref 30.0–36.0)
MCV: 89.1 fL (ref 80.0–100.0)
Platelets: 431 10*3/uL — ABNORMAL HIGH (ref 150–400)
RBC: 3.48 MIL/uL — ABNORMAL LOW (ref 4.22–5.81)
RDW: 13.4 % (ref 11.5–15.5)
WBC: 7.3 10*3/uL (ref 4.0–10.5)
nRBC: 0 % (ref 0.0–0.2)

## 2020-12-08 LAB — PHOSPHORUS: Phosphorus: 3.1 mg/dL (ref 2.5–4.6)

## 2020-12-08 LAB — MAGNESIUM: Magnesium: 1.8 mg/dL (ref 1.7–2.4)

## 2020-12-08 NOTE — TOC Initial Note (Signed)
Transition of Care [facility]) - Initial/Assessment Note    Patient Details  Name: Christopher Gross MRN: [id number] Date of Birth: August 02, 1924  Transition of [facility]) CM/SW Contact:    Ida Rogue, LCSW Phone Number: 12/08/2020, 2:35 PM  Clinical Narrative:     Spoke with wife in follow up to PT recommendation of LTC. Ms Sowles does not plan to take him home as she has no help here, and can no longer handle him at home.  When I asked about hospice, she indicated that she is awaiting a call from palliative MD to discuss further. She understands if he went to a facility she would be paying out of pocket for that. Will follow palliative lead in terms of dispositional planning. TOC will continue to follow during the course of hospitalization.               Expected Discharge Plan: Rest Home Barriers to Discharge: SNF Pending bed offer   Patient Goals and CMS Choice        Expected Discharge Plan and Services Expected Discharge Plan: Rest Home                                              Prior Living Arrangements/Services                       Activities of Daily Living Home Assistive Devices/Equipment: Other (Comment) (Walking Stick) ADL Screening (condition at time of admission) Patient's cognitive ability adequate to safely complete daily activities?: No Is the patient deaf or have difficulty hearing?: Yes Does the patient have difficulty seeing, even when wearing glasses/contacts?: No Does the patient have difficulty concentrating, remembering, or making decisions?: Yes Patient able to express need for assistance with ADLs?: No Does the patient have difficulty dressing or bathing?: Yes Independently performs ADLs?: No Communication: Needs assistance Dressing (OT): Needs assistance Is this a change from baseline?: Pre-admission baseline Grooming: Needs assistance Is this a change from baseline?: Pre-admission baseline Feeding: Needs assistance Is this a  change from baseline?: Pre-admission baseline Bathing: Needs assistance Is this a change from baseline?: Pre-admission baseline Toileting: Needs assistance Is this a change from baseline?: Pre-admission baseline In/Out Bed: Needs assistance Is this a change from baseline?: Pre-admission baseline Walks in Home: Needs assistance Is this a change from baseline?: Pre-admission baseline Does the patient have difficulty walking or climbing stairs?: Yes Weakness of Legs: Both Weakness of Arms/Hands: None  Permission Sought/Granted                  Emotional Assessment              Admission diagnosis:  Cellulitis of toe of left foot [L03.032] Fall, initial encounter [W19.XXXA] Skin tear of left elbow without complication, initial encounter [S51.012A] Infection of great toe [L08.9] Fall [W19.XXXA] Patient Active Problem List   Diagnosis Date Noted   Palliative care by specialist    General weakness    Hypernatremia 11/30/2020   Fever 11/29/2020   Fall 11/29/2020   Pneumonia 11/28/2020   Dysphagia 11/28/2020   Fall at home, initial encounter 11/27/2020   Abnormal CT scan 11/27/2020   Infection of great toe 11/26/2020   Acute encephalopathy 11/26/2020   Dementia (HCC) 11/26/2020   GAD (generalized anxiety disorder) 04/21/2020   Goals of care, counseling/discussion 02/16/2020   Deficiency anemia  02/13/2020   Cough with sputum 02/13/2020   Inguinal hernia of left side without obstruction or gangrene 02/13/2020   BPH (benign prostatic hypertrophy) 03/19/2014   Routine general medical examination at a health care facility 03/19/2014   PCP:  Etta Grandchild, MD Pharmacy:   [facility] [phone] - , [place] - [address] AT [address] OF [facility] [address] [place] [address] Phone: [phone] Fax: [phone]     Social Determinants of Health (SDOH) Interventions    Readmission Risk Interventions No flowsheet data found.

## 2020-12-08 NOTE — Evaluation (Signed)
Occupational Therapy Evaluation Patient Details Name: Christopher Gross MRN: [id number] DOB: 29-Oct-1924 Today's Date: 12/08/2020   History of Present Illness 87 yrs old male with PMH significant of BPH, dementia who presented with s/p mechanical fall.  He was noted to have ingrown toenail on L first toe with erythema of first toe and was started on abx with podiatry consult.  Podiatry recommending abx only and with low suspicion for osteomyelitis. Hospitalization complicated by delirium and aspiration pneumonia. Palliative care is following, now planning for comfort feeds, continuign GOC discussions   Clinical Impression   Patient is a 87 year old male who was noted to have had significant decline in ability to participate in ADLs.  Per chart patient was completing functional mobility himself prior to hospitalization. Patient currently is TD +2 for all movements. Patient is noted to not be able to follow one step commands with no initiation to movement. Patient was not combative on this date. Patient was TD for all ADLs at this time. Pallative is involved at this time as well. Patient would benefit from LTC placement for continued 24/7 support in next level of care. Patient does not have a skilled OT need at this time. OT singing off at this time.      Recommendations for follow up therapy are one component of a multi-disciplinary discharge planning process, led by the attending physician.  Recommendations may be updated based on patient status, additional functional criteria and insurance authorization.   Follow Up Recommendations  Long-term institutional care without follow-up therapy    Assistance Recommended at Discharge Frequent or constant Supervision/Assistance  Functional Status Assessment  Patient has had a recent decline in their functional status and/or demonstrates limited ability to make significant improvements in function in a reasonable and predictable amount of time  Equipment  Recommendations  None recommended by OT    Recommendations for Other Services       Precautions / Restrictions Precautions Precautions: Fall Precaution Comments: can be aggressive, has L HA Restrictions Weight Bearing Restrictions: No      Mobility Bed Mobility Overal bed mobility: Needs Assistance Bed Mobility: Rolling Rolling: Total assist;+2 for physical assistance   Supine to sit: Total assist;+2 for physical assistance;+2 for safety/equipment Sit to supine: Total assist;+2 for physical assistance;+2 for safety/equipment   General bed mobility comments: patient was in fetal position at start of session with TD to transition to R side with patient preferring to be in flexed posture.    Transfers   Equipment used: 2 person hand held assist               General transfer comment: attempted, does not bear weight to rise, noted incontinent of BM,      Balance Overall balance assessment: Needs assistance Sitting-balance support: Feet supported;Single extremity supported;No upper extremity supported Sitting balance-Leahy Scale: Poor Sitting balance - Comments: pt with trunk flexed, anterior lean in sitting. very briefly able to maintain sitting  EOB with close supervision/min/guard, otherwise requires assist to maintain midline Postural control: Other (comment)                                 ADL either performed or assessed with clinical judgement   ADL Overall ADL's : Needs assistance/impaired Eating/Feeding: Maximal assistance;Bed level   Grooming: Bed level;Maximal assistance Grooming Details (indicate cue type and reason): patient was able to wipe face with noted drool while sitting on edge of  bed when provided a washcloth. Upper Body Bathing: Bed level;Total assistance   Lower Body Bathing: Bed level;Total assistance       Lower Body Dressing: Bed level;Total assistance   Toilet Transfer: +2 for safety/equipment;+2 for physical  assistance   Toileting- Clothing Manipulation and Hygiene: Total assistance;Bed level       Functional mobility during ADLs: +2 for physical assistance;+2 for safety/equipment       Vision   Additional Comments: unable to assess with patient preferring to keep L eye open but towards end of session able to open both eyes. unable to follow commands or respond to complete formal eye testing. able to scan to find a  person in room  when someone is speaking. most of time not coordinated with one who is verbalizing     Perception     Praxis      Pertinent Vitals/Pain Pain Assessment: Faces Faces Pain Scale: Hurts a little bit Breathing: normal Negative Vocalization: none Facial Expression: smiling or inexpressive Body Language: relaxed Consolability: no need to console PAINAD Score: 0 Pain Location: LUE grimances with initation of movements Pain Descriptors / Indicators: Grimacing Pain Intervention(s): Monitored during session     Hand Dominance     Extremity/Trunk Assessment Upper Extremity Assessment Upper Extremity Assessment: Difficult to assess due to impaired cognition;RUE deficits/detail;LUE deficits/detail RUE Deficits / Details: patient unable to follow MMT or ROM. noted to have strong bilateral hand grasp when grabbing onto bed rail and therapist arm/hands LUE Deficits / Details: patient noted to have redness above IV site with some heat. MD in room and made aware during session.   Lower Extremity Assessment Lower Extremity Assessment: Defer to PT evaluation   Cervical / Trunk Assessment Cervical / Trunk Assessment: Kyphotic   Communication Communication Communication: HOH (hearing aid in L ear)   Cognition Arousal/Alertness: Lethargic Behavior During Therapy: Flat affect Overall Cognitive Status: History of cognitive impairments - at baseline Area of Impairment: Following commands                 Orientation Level: Person;Disoriented  to;Place;Time;Situation     Following Commands: Follows one step commands inconsistently       General Comments: patient reported he wanted to be called "Christopher Gross" when asked. patient did become more alert during session. patient reported yes when asked if hungry at end of session. patient unable to consistently follow any commands     General Comments       Exercises     Shoulder Instructions      Home Living Family/patient expects to be discharged to:: Skilled nursing facility Living Arrangements: Spouse/significant other                                      Prior Functioning/Environment Prior Level of Function : Patient poor historian/Family not available               ADLs Comments: no family present nurse reported patient was walking prior to admission        OT Problem List: Decreased range of motion;Decreased activity tolerance;Impaired balance (sitting and/or standing);Decreased safety awareness;Decreased knowledge of use of DME or AE;Decreased knowledge of precautions      OT Treatment/Interventions:      OT Goals(Current goals can be found in the care plan section) Acute Rehab OT Goals Patient Stated Goal: none stated OT Goal Formulation: Patient unable to participate in goal  setting  OT Frequency:     Barriers to D/C:            Co-evaluation PT/OT/SLP Co-Evaluation/Treatment: Yes Reason for Co-Treatment: For patient/therapist safety;To address functional/ADL transfers PT goals addressed during session: Mobility/safety with mobility OT goals addressed during session: ADL's and self-care      AM-PAC OT "6 Clicks" Daily Activity     Outcome Measure Help from another person eating meals?: Total Help from another person taking care of personal grooming?: Total Help from another person toileting, which includes using toliet, bedpan, or urinal?: Total Help from another person bathing (including washing, rinsing, drying)?: Total Help from  another person to put on and taking off regular upper body clothing?: Total Help from another person to put on and taking off regular lower body clothing?: Total 6 Click Score: 6   End of Session Nurse Communication: Other (comment) (nurse cleared patient to participate)  Activity Tolerance: Patient limited by fatigue Patient left: in bed;with call bell/phone within reach;with bed alarm set;with nursing/sitter in room  OT Visit Diagnosis: Unsteadiness on feet (R26.81);Other abnormalities of gait and mobility (R26.89)                Time: 4193-7902 OT Time Calculation (min): 22 min Charges:  OT General Charges $OT Visit: 1 Visit OT Evaluation $OT Eval Low Complexity: 1 Low  Viann Shove, MS Acute Rehabilitation Department Office# [phone] Pager# [phone]   Ardyth Harps 12/08/2020, 10:49 AM

## 2020-12-08 NOTE — Progress Notes (Signed)
PROGRESS NOTE    Christopher Gross  WUJ:[id number] DOB: 10/20/1924 DOA: 11/26/2020 PCP: Etta Grandchild, MD   Brief Narrative:  This 87 yrs old male with PMH significant of BPH, dementia who presented with s/p mechanical fall.  He was noted to have ingrown toenail on L first toe with erythema of first toe and was started on abx with podiatry consult.  Podiatry recommending abx only and with low suspicion for osteomyelitis. Hospitalization complicated by delirium and aspiration pneumonia. Palliative care is following, now planning for comfort feeds.  Seen by PT OT with recommendation for long-term institutional care without follow-up therapy.  12/08/2020: Patient was seen and examined at his bedside.  Alert, in no acute distress, has no new complaints.  Assessment & Plan:   Principal Problem:   Fall at home, initial encounter Active Problems:   Goals of care, counseling/discussion   Infection of great toe   Acute encephalopathy   Dementia (HCC)   Abnormal CT scan   Pneumonia   Dysphagia   Fever   Fall   Hypernatremia   Palliative care by specialist   General weakness  Fall at home, Initial encounter: CT head/neck without any acute findings. PT and OT with recommendation for long-term institutional care without follow-up therapy. Continue fall precautions  Goals of care discussion:  Appreciate palliative care assistance, recommended comfort feeds with precaution.   Follow course for next few days, then make further decisions regarding SNF with palliative vs hospice.  Pneumonia: CXR concerning for pneumonia with bibasilar infiltrates with pneumonia or aspiration. Completed course of IV antibiotics Rocephin and IV Flagyl.  Urine culture : No growth and Blood culture NGTD Speech eval recommended comfort feeds. Completed antibiotic course for 7 days( last date 11/27)  Dementia:  Stable.  Continue delirium precautions Fall precautions, aspiration precautions   Acute  encephalopathy : Likely secondary to acute hospitalization, dementia, infection. Continue delirium precautions. Continue soft mittens for safety.   Dysphagia: Speech eval. Recommending comfort feeds.  Ingrown toenail left foot: MRI not diagnostic.  CRP 5.6, Sed rate 57. Discussed with podiatry, recommended Keflex, mupirocin ointment and dressing changes around the nail. Completed ceftriaxone and Flagyl for pneumonia.   Resolved posttreatment hypernatremia : D5W DC'd on 12/08/2020. Serum sodium 132.  Resolved post repletion: Hypokalemia:  Abnormal CT scan: Spiculated density of visualized right upper lobe, will discuss with family --- at this time, can hold off on further workup.    DVT prophylaxis: Lovenox subcu daily Code Status:DNR Family Communication: No family at bedside. Disposition Plan:   Status is: Inpatient  Medically cleared for discharge.   Consultants:  Podiatry Palliative care  Procedures: None.  Antimicrobials:  Anti-infectives (From admission, onward)    Start     Dose/Rate Route Frequency Ordered Stop   11/29/20 1315  metroNIDAZOLE (FLAGYL) IVPB 500 mg  Status:  Discontinued        500 mg 100 mL/hr over 60 Minutes Intravenous Every 12 hours 11/29/20 1222 12/06/20 0859   11/28/20 1800  cefTRIAXone (ROCEPHIN) 1 g in sodium chloride 0.9 % 100 mL IVPB  Status:  Discontinued        1 g 200 mL/hr over 30 Minutes Intravenous Every 24 hours 11/28/20 1649 12/06/20 0859   11/28/20 1215  ceFAZolin (ANCEF) IVPB 1 g/50 mL premix  Status:  Discontinued        1 g 100 mL/hr over 30 Minutes Intravenous Every 12 hours 11/28/20 1125 11/28/20 1649   11/27/20 1000  cefTRIAXone (ROCEPHIN) 2  g in sodium chloride 0.9 % 100 mL IVPB  Status:  Discontinued       See Hyperspace for full Linked Orders Report.   2 g 200 mL/hr over 30 Minutes Intravenous Every 24 hours 11/26/20 2349 11/28/20 1122   11/27/20 1000  metroNIDAZOLE (FLAGYL) IVPB 500 mg  Status:  Discontinued         500 mg 100 mL/hr over 60 Minutes Intravenous Every 12 hours 11/27/20 0048 11/28/20 1122   11/27/20 0000  metroNIDAZOLE (FLAGYL) tablet 500 mg  Status:  Discontinued       See Hyperspace for full Linked Orders Report.   500 mg Oral Every 12 hours 11/26/20 2349 11/27/20 0048   11/26/20 2345  piperacillin-tazobactam (ZOSYN) IVPB 3.375 g        3.375 g 100 mL/hr over 30 Minutes Intravenous  Once 11/26/20 2335 11/27/20 0013         Objective: Vitals:   12/06/20 2222 12/07/20 0615 12/07/20 2155 12/08/20 0511  BP: (!) 137/55 (!) 123/47 138/61 (!) 115/46  Pulse: 85 80 88 85  Resp: 20 16 16 20   Temp: 98.2 F (36.8 C) 97.8 F (36.6 C) 97.8 F (36.6 C) 98.2 F (36.8 C)  TempSrc: Oral Oral Oral Oral  SpO2: 95% 95% 94% 96%  Weight:      Height:        Intake/Output Summary (Last 24 hours) at 12/08/2020 1417 Last data filed at 12/08/2020 0908 Gross per 24 hour  Intake 110 ml  Output 400 ml  Net -290 ml   Filed Weights   11/26/20 2153  Weight: 49.9 kg    Examination:  General exam: Frail-appearing no acute distress.  He is alert and confused. Respiratory system: Clear to auscultation with no wheezes or rales.  Poor inspiratory effort.   Cardiovascular system: Regular rate and rhythm no rubs or gallops.   Gastrointestinal system: Soft nontender normal, bowel sounds present.   Central nervous system: Alert and confused in the setting of dementia. Extremities: Trace edema. Skin: Pressure wounds Psychiatry: Mood is appropriate for condition and setting.    Data Reviewed: I have personally reviewed following labs and imaging studies  CBC: Recent Labs  Lab 12/02/20 0538 12/03/20 0528 12/05/20 0541 12/08/20 0535  WBC 13.8* 9.3 9.2 7.3  HGB 12.0* 11.2* 10.0* 10.4*  HCT 36.9* 35.0* 30.8* 31.0*  MCV 92.3 92.3 91.9 89.1  PLT 291 289 337 [id number]*   Basic Metabolic Panel: Recent Labs  Lab 12/02/20 0538 12/03/20 0528 12/05/20 0541 12/06/20 0524 12/07/20 0535  12/08/20 0535  NA 142 140 136 135 131* 132*  K 2.8* 3.7 2.8* 3.0* 3.2* 3.9  CL 108 107 104 103 102 101  CO2 24 27 24 24 24 23   GLUCOSE 107* 110* 121* 101* 114* 124*  BUN 23 21 20 17 16 17   CREATININE 1.04 0.97 0.95 1.04 1.02 1.07  CALCIUM 9.0 9.1 8.7* 8.6* 8.7* 8.6*  MG 1.8 1.9 1.9  --   --  1.8  PHOS 2.4* 2.6 2.6  --   --  3.1   GFR: Estimated Creatinine Clearance: 29.8 mL/min (by C-G formula based on SCr of 1.07 mg/dL). Liver Function Tests: No results for input(s): AST, ALT, ALKPHOS, BILITOT, PROT, ALBUMIN in the last 168 hours.  No results for input(s): LIPASE, AMYLASE in the last 168 hours. No results for input(s): AMMONIA in the last 168 hours. Coagulation Profile: No results for input(s): INR, PROTIME in the last 168 hours. Cardiac Enzymes: No results  for input(s): CKTOTAL, CKMB, CKMBINDEX, TROPONINI in the last 168 hours. BNP (last 3 results) No results for input(s): PROBNP in the last 8760 hours. HbA1C: No results for input(s): HGBA1C in the last 72 hours. CBG: No results for input(s): GLUCAP in the last 168 hours. Lipid Profile: No results for input(s): CHOL, HDL, LDLCALC, TRIG, CHOLHDL, LDLDIRECT in the last 72 hours. Thyroid Function Tests: No results for input(s): TSH, T4TOTAL, FREET4, T3FREE, THYROIDAB in the last 72 hours. Anemia Panel: No results for input(s): VITAMINB12, FOLATE, FERRITIN, TIBC, IRON, RETICCTPCT in the last 72 hours. Sepsis Labs: Recent Labs  Lab 12/03/20 1256  PROCALCITON 0.23    Recent Results (from the past 240 hour(s))  Urine Culture     Status: None   Collection Time: 11/29/20  8:40 AM   Specimen: Urine, Clean Catch  Result Value Ref Range Status   Specimen Description   Final    URINE, CLEAN CATCH Performed at [facility], [place] [address] [place], [place] [address]    Special Requests   Final    NONE Performed at [facility], [place] [address], [place], [place] [address]    Culture    Final    NO GROWTH Performed at [facility], [place] [address], [place], [place] [address]    Report Status 11/30/2020 FINAL  Final  Culture, blood (routine x 2)     Status: None   Collection Time: 11/29/20  8:43 AM   Specimen: BLOOD  Result Value Ref Range Status   Specimen Description   Final    BLOOD RIGHT ARM Performed at [place] [address] [place], [place] [address]    Special Requests   Final    BOTTLES DRAWN AEROBIC AND ANAEROBIC Blood Culture adequate volume Performed at [place] [address] [place], [place] [address]    Culture   Final    NO GROWTH 5 DAYS Performed at [facility], [place] [address], [place], [place] [address]    Report Status 12/04/2020 FINAL  Final  Culture, blood (routine x 2)     Status: None   Collection Time: 11/29/20  8:49 AM   Specimen: BLOOD  Result Value Ref Range Status   Specimen Description   Final    BLOOD LEFT ANTECUBITAL Performed at [place] [facility] [address], [place], [place] [address]    Special Requests   Final    BOTTLES DRAWN AEROBIC AND ANAEROBIC Blood Culture adequate volume Performed at [facility] [address] [place], [place] [address]    Culture   Final    NO GROWTH 5 DAYS Performed at [facility], [place] [address], [place], [place] [address]    Report Status 12/04/2020 FINAL  Final    Radiology Studies: No results found.  Scheduled Meds:  enoxaparin (LOVENOX) injection  30 mg Subcutaneous Q24H   feeding supplement  237 mL Oral TID BM   multivitamin with minerals  1 tablet Oral Daily   mupirocin ointment   Topical BID   Continuous Infusions:     LOS: 9 days    Time spent: 25 mins    Kayleen Memos, MD [facility]   If 7PM-7AM, please contact night-coverage

## 2020-12-08 NOTE — Progress Notes (Signed)
Daily Progress Note   Patient Name: Christopher Gross       Date: 12/08/2020 DOB: [birth year]  Age: 87 y.o. MRN#: [id number] Attending Physician: Darlin Drop, DO Primary Care Physician: Etta Grandchild, MD Admit Date: 11/26/2020  Reason for Consultation/Follow-up: Establishing goals of care  Subjective: I saw and examined Christopher Gross.  He did not wake with gentle verbal or tactile stimulation.  I called and discussed options for care moving forward with his wife.  Discussed continued decline in his cognition, nutrition, and functional status and we discussed recommendation for hospice as well as options for hospice including home hospice, LTC with hospice support, and residential hospice.  She would like to have him evaluated for residential hospice.   Length of Stay: 9  Current Medications: Scheduled Meds:   enoxaparin (LOVENOX) injection  30 mg Subcutaneous Q24H   feeding supplement  237 mL Oral TID BM   multivitamin with minerals  1 tablet Oral Daily   mupirocin ointment   Topical BID    Continuous Infusions:    PRN Meds: acetaminophen **OR** acetaminophen, ondansetron **OR** ondansetron (ZOFRAN) IV  Physical Exam          Sleepy No distress Regular work of breathing S 1 S 2  Abdomen is not distended  Vital Signs: BP (!) 141/78 (BP Location: Right Arm)   Pulse 91   Temp 98 F (36.7 C) (Oral)   Resp 20   Ht 5\' 4"  (1.626 m)   Wt 49.9 kg   SpO2 96%   BMI 18.88 kg/m  SpO2: SpO2: 96 % O2 Device: O2 Device: Room Air O2 Flow Rate:    Intake/output summary:  Intake/Output Summary (Last 24 hours) at 12/08/2020 1429 Last data filed at 12/08/2020 0908 Gross per 24 hour  Intake 110 ml  Output 400 ml  Net -290 ml    LBM: Last BM Date: 12/07/20 Baseline Weight:  Weight: 49.9 kg Most recent weight: Weight: 49.9 kg       Palliative Assessment/Data:      Patient Active Problem List   Diagnosis Date Noted   Palliative care by specialist    General weakness    Hypernatremia 11/30/2020   Fever 11/29/2020   Fall 11/29/2020   Pneumonia 11/28/2020   Dysphagia 11/28/2020   Fall at home, initial encounter  11/27/2020   Abnormal CT scan 11/27/2020   Infection of great toe 11/26/2020   Acute encephalopathy 11/26/2020   Dementia (HCC) 11/26/2020   GAD (generalized anxiety disorder) 04/21/2020   Goals of care, counseling/discussion 02/16/2020   Deficiency anemia 02/13/2020   Cough with sputum 02/13/2020   Inguinal hernia of left side without obstruction or gangrene 02/13/2020   BPH (benign prostatic hypertrophy) 03/19/2014   Routine general medical examination at a health care facility 03/19/2014    Palliative Care Assessment & Plan   Patient Profile:    Assessment:  87 yrs old male with PMH significant of BPH, dementia who presented with s/p mechanical fall.  He was noted to have ingrown toenail on L first toe with erythema of first toe and was started on abx with podiatry consult.  Podiatry recommending abx only and with low suspicion for osteomyelitis. Hospitalization complicated by delirium and aspiration pneumonia.  Palliative care following for GOC.   Recommendations/Plan: -His wife would like to have him evaluated for residential hospice.  Code Status:    Code Status Orders  (From admission, onward)           Start     Ordered   11/28/20 1133  Do not attempt resuscitation (DNR)  Continuous       Question Answer Comment  In the event of cardiac or respiratory ARREST Do not call a "code blue"   In the event of cardiac or respiratory ARREST Do not perform Intubation, CPR, defibrillation or ACLS   In the event of cardiac or respiratory ARREST Use medication by any route, position, wound care, and other measures to relive pain  and suffering. May use oxygen, suction and manual treatment of airway obstruction as needed for comfort.      11/28/20 1132           Code Status History     Date Active Date Inactive Code Status Order ID Comments User Context   11/26/2020 2353 11/28/2020 1132 Full Code [id number]  Hillary Bow, DO ED       Prognosis:  Unable to determine  Discharge Planning: To Be Determined  Care plan was discussed with IDT, bedside RN.    Thank you for allowing the Palliative Medicine Team to assist in the care of this patient.   Total Time 25 Prolonged Time Billed  no   Greater than 50%  of this time was spent counseling and coordinating care related to the above assessment and plan.   Romie Minus, MD  Please contact Palliative Medicine Team phone at [phone] for questions and concerns.

## 2020-12-08 NOTE — Progress Notes (Signed)
Physical Therapy Treatment Patient Details Name: Christopher Gross MRN: [id number] DOB: [birth year] Today's Date: 12/08/2020   History of Present Illness 87 yrs old male with PMH significant of BPH, dementia who presented with s/p mechanical fall.  He was noted to have ingrown toenail on L first toe with erythema of first toe and was started on abx with podiatry consult.  Podiatry recommending abx only and with low suspicion for osteomyelitis. Hospitalization complicated by delirium and aspiration pneumonia. Palliative care is following, now planning for comfort feeds, continuign GOC discussions    PT Comments    Patient  resting on Left side, fetal position. Maximum stimulation to arouse patient , rolling to back. Total assistance to sit up, still maintains flexed posture, unable to bear weight to stand up.  Patient did respond with a few words: "[name]", yes  to are you hungry.  Patient does not follow any directions, not combative or agitated. CNA in to assist with feeding.  Noted redness right hip, left arm and back. MD iin and aware.  Patient may can benefit from low air loss mattress with risk for skin breakdown.  Recommendations for follow up therapy are one component of a multi-disciplinary discharge planning process, led by the attending physician.  Recommendations may be updated based on patient status, additional functional criteria and insurance authorization.  Follow Up Recommendations  No PT follow up- LTC     Assistance Recommended at Discharge Frequent or constant Supervision/Assistance  Equipment Recommendations  None recommended by PT    Recommendations for Other Services       Precautions / Restrictions Precautions Precautions: Fall Precaution Comments: can be aggressive, has L HA Restrictions Weight Bearing Restrictions: No     Mobility  Bed Mobility   Bed Mobility: Rolling Rolling: Total assist;+2 for physical assistance   Supine to sit: Total assist;+2 for  physical assistance;+2 for safety/equipment Sit to supine: Total assist;+2 for physical assistance;+2 for safety/equipment   General bed mobility comments: patient curled in fetal position to left, assisted to roll to right and sit up onto bed edge, tending to stay in flexed posture, total assist for all mobility    Transfers   Equipment used: 2 person hand held assist               General transfer comment: attempted, does not bear weight to rise, noted incontinent of BM,    Ambulation/Gait                   Stairs             Wheelchair Mobility    Modified Rankin (Stroke Patients Only)       Balance Overall balance assessment: Needs assistance Sitting-balance support: Feet supported;Single extremity supported;No upper extremity supported Sitting balance-Leahy Scale: Poor Sitting balance - Comments: pt with trunk flexed, anterior lean in sitting. very briefly able to maintain sitting  EOB with close supervision/min/guard, otherwise requires assist to maintain midline                                    Cognition Arousal/Alertness: Lethargic Behavior During Therapy: Flat affect Overall Cognitive Status: History of cognitive impairments - at baseline Area of Impairment: Following commands                 Orientation Level: Person;Disoriented to;Place;Time;Situation     Following Commands: Follows one step commands inconsistently  General Comments: patient reported he wanted to be called "[name]" when asked. patient did become more alert during session. patient reported yes when asked if hungry at end of session. patient unable to consistently follow any commands        Exercises      General Comments        Pertinent Vitals/Pain Pain Assessment: Faces Faces Pain Scale: Hurts a little bit Breathing: normal Negative Vocalization: none Facial Expression: smiling or inexpressive Body Language: relaxed Consolability:  no need to console PAINAD Score: 0 Pain Location: LUE grimances with initation of movements Pain Descriptors / Indicators: Grimacing Pain Intervention(s): Monitored during session    Home Living Family/patient expects to be discharged to:: Skilled nursing facility Living Arrangements: Spouse/significant other                      Prior Function            PT Goals (current goals can now be found in the care plan section) Progress towards PT goals: Progressing toward goals    Frequency    Min 1X/week      PT Plan Current plan remains appropriate    Co-evaluation   Reason for Co-Treatment: For patient/therapist safety;To address functional/ADL transfers PT goals addressed during session: Mobility/safety with mobility OT goals addressed during session: ADL's and self-care      AM-PAC PT "6 Clicks" Mobility   Outcome Measure  Help needed turning from your back to your side while in a flat bed without using bedrails?: Total Help needed moving from lying on your back to sitting on the side of a flat bed without using bedrails?: Total Help needed moving to and from a bed to a chair (including a wheelchair)?: Total Help needed standing up from a chair using your arms (e.g., wheelchair or bedside chair)?: Total Help needed to walk in hospital room?: Total Help needed climbing 3-5 steps with a railing? : Total 6 Click Score: 6    End of Session   Activity Tolerance: Patient tolerated treatment well (did arouse with stiulation) Patient left: in bed;with nursing/sitter in room;with bed alarm set Nurse Communication: Mobility status PT Visit Diagnosis: Other abnormalities of gait and mobility (R26.89);Muscle weakness (generalized) (M62.81)     Time: 1423-9532 PT Time Calculation (min) (ACUTE ONLY): 21 min  Charges:  $Therapeutic Activity: 8-22 mins                     Blanchard Kelch PT Acute Rehabilitation Services Pager [phone] Office  [phone]    Rada Hay 12/08/2020, 9:34 AM

## 2020-12-09 MED ORDER — DEXTROSE-NACL 5-0.9 % IV SOLN: INTRAVENOUS | Status: DC

## 2020-12-09 NOTE — Discharge Summary (Signed)
Discharge Summary  Christopher Gross CZY:[id number] DOB: Jul 01, 1924  PCP: Etta Grandchild, MD  Admit date: 11/26/2020 Discharge date: 12/09/2020  Time spent: 35 minutes.  Recommendations for Outpatient Follow-up:  Continue with hospice care.  Discharge Diagnoses:  Active Hospital Problems   Diagnosis Date Noted   Fall at home, initial encounter 11/27/2020   Palliative care by specialist    General weakness    Hypernatremia 11/30/2020   Fever 11/29/2020   Fall 11/29/2020   Pneumonia 11/28/2020   Dysphagia 11/28/2020   Abnormal CT scan 11/27/2020   Infection of great toe 11/26/2020   Acute encephalopathy 11/26/2020   Dementia (HCC) 11/26/2020   Goals of care, counseling/discussion 02/16/2020    Resolved Hospital Problems  No resolved problems to display.    Discharge Condition: Stable  Diet recommendation: Comfort feeds  Vitals:   12/08/20 1603 12/09/20 1607  BP: (!) 148/74 (!) 153/60  Pulse: 90 (!) 102  Resp: 19 19  Temp: 98.2 F (36.8 C) 98.7 F (37.1 C)  SpO2: 94% 97%    History of present illness:  This 87 yrs old male with PMH significant of BPH, dementia who presented from home after a mechanical fall.  He had a left foot x-ray which showed suspicion for osteomyelitis in the proximal aspect of the distal first phalanx.  He was noted to have ingrown toenail on L first toe with erythema of first toe and was started on abx with podiatry consult.  Podiatry recommended abx only and with low suspicion for osteomyelitis.    Hospitalization complicated by delirium and aspiration pneumonia.  He completed 7 days of antibiotics, last 12/05/2020.  Was seen by speech therapist with recommendation for comfort feeds.  Seen by PT OT with recommendation for long-term institutional care without follow-up therapy.  Palliative care team consulted, following, and assisting with goals of care discussions.   12/09/2020: Patient seen and examined at bedside.  Minimally interactive.  He  has no complaints.  Seen by palliative, referral placed to [facility] per family preference.  Plan for residential hospice, [facility].  Hospital Course:  Principal Problem:   Fall at home, initial encounter Active Problems:   Goals of care, counseling/discussion   Infection of great toe   Acute encephalopathy   Dementia (HCC)   Abnormal CT scan   Pneumonia   Dysphagia   Fever   Fall   Hypernatremia   Palliative care by specialist   General weakness  Fall at home, Initial encounter: CT head/neck without any acute findings. PT and OT with recommendation for long-term institutional care without follow-up therapy. Continue fall precautions   Goals of care discussion:  Seen by palliative, referral placed to [facility] per family preference.  Plan for residential hospice versus long-term care with hospice support.   Treated community-acquired pneumonia, POA: CXR concerning for pneumonia with bibasilar infiltrates with pneumonia or aspiration. Completed course of IV antibiotics Rocephin and IV Flagyl.  Urine culture : No growth and Blood culture NGTD Speech eval recommended comfort feeds. Completed antibiotic course for 7 days( last date 12/05/20)   Dementia:  Stable.  Continue delirium precautions Continue fall precautions, aspiration precautions   Acute encephalopathy : Likely secondary to acute hospitalization, dementia, infection. Continue delirium precautions. Reorient as needed.   Dysphagia: Speech eval. Recommending comfort feeds.   Ingrown toenail left foot: MRI not diagnostic.  CRP 5.6, Sed rate 57. Discussed with podiatry, recommended Keflex, mupirocin ointment and dressing changes around the nail. Completed ceftriaxone and Flagyl  for pneumonia.   Resolved posttreatment hypernatremia secondary to poor oral intake: D5W DC'd on 12/08/2020. Oral intake has not improved   Resolved post repletion: Hypokalemia: Serum potassium 3.9.   Abnormal CT  scan: Spiculated density of visualized right upper lobe, incidentally found on CT cervical spine.          Code Status:DNR  Consultants:  Podiatry Palliative care   Procedures: None.     Discharge Exam: BP (!) 153/60 (BP Location: Left Leg)   Pulse (!) 102   Temp 98.7 F (37.1 C)   Resp 19   Ht 5\' 4"  (1.626 m)   Wt 49.9 kg   SpO2 97%   BMI 18.88 kg/m  General: 87 y.o. year-old male frail-appearing in no acute distress. male frail-appearing in no acute distress.  Alert and minimally interactive. Cardiovascular: Regular rate and rhythm with no rubs or gallops.  No thyromegaly or JVD noted.   Respiratory: Clear to auscultation with no wheezes or rales.   Abdomen: Soft nontender nondistended with normal bowel sounds x4 quadrants. Musculoskeletal: Trace lower extremity edema. Psychiatry: Mood is appropriate for condition and setting  Discharge Instructions You were cared for by a hospitalist during your hospital stay. If you have any questions about your discharge medications or the care you received while you were in the hospital after you are discharged, you can call the unit and asked to speak with the hospitalist on call if the hospitalist that took care of you is not available. Once you are discharged, your primary care physician will handle any further medical issues. Please note that NO REFILLS for any discharge medications will be authorized once you are discharged, as it is imperative that you return to your primary care physician (or establish a relationship with a primary care physician if you do not have one) for your aftercare needs so that they can reassess your need for medications and monitor your lab values.   Allergies as of 12/09/2020       Reactions   Aricept [donepezil] Other (See Comments)        Medication List     STOP taking these medications    CALCIUM 600 PO   clonazePAM 1 MG tablet Commonly known as: KlonoPIN   dextromethorphan 30 MG/5ML liquid Commonly known as: Delsym    finasteride 5 MG tablet Commonly known as: PROSCAR   MULTI FOR HIM 50+ PO       Allergies  Allergen Reactions   Aricept [Donepezil] Other (See Comments)      The results of significant diagnostics from this hospitalization (including imaging, microbiology, ancillary and laboratory) are listed below for reference.    Significant Diagnostic Studies: CT Head Wo Contrast  Result Date: 11/26/2020 CLINICAL DATA:  Witnessed fall, head and neck trauma EXAM: CT HEAD WITHOUT CONTRAST CT CERVICAL SPINE WITHOUT CONTRAST TECHNIQUE: Multidetector CT imaging of the head and cervical spine was performed following the standard protocol without intravenous contrast. Multiplanar CT image reconstructions of the cervical spine were also generated. COMPARISON:  03/28/2019 FINDINGS: CT HEAD FINDINGS Brain: Normal anatomic configuration. Parenchymal volume loss is commensurate with the patient's age. Mild periventricular white matter changes are present likely reflecting the sequela of small vessel ischemia. No abnormal intra or extra-axial mass lesion or fluid collection. No abnormal mass effect or midline shift. No evidence of acute intracranial hemorrhage or infarct. Mild ventriculomegaly is stable. Cerebellum unremarkable. Vascular: No asymmetric hyperdense vasculature at the skull base. Skull: Intact Sinuses/Orbits: Paranasal sinuses are clear. Ocular lenses have been removed. Orbits are  otherwise unremarkable. Other: Mastoid air cells and middle ear cavities are clear. CT CERVICAL SPINE FINDINGS Alignment: Normal cervical lordosis. Skull base and vertebrae: Craniocervical alignment is normal. The atlantodental interval is not widened. Degenerative changes are noted at the atlantodental articulation. No acute fracture of the cervical spine. Vertebral body height is preserved. Soft tissues and spinal canal: No prevertebral fluid or swelling. No visible canal hematoma. Disc levels: There is diffuse intervertebral  disc space narrowing and endplate remodeling throughout the cervical spine in keeping with severe degenerative disc disease. The prevertebral soft tissues are not thickened. Review of the axial images demonstrates no significant spinal canal stenosis. Multilevel facet arthrosis results in multilevel mild neuroforaminal narrowing, most severe on the right at C3-4. Upper chest: A sub solid spiculated density is seen within the visualized right apex, incompletely evaluated on this examination and indeterminate. Other: None IMPRESSION: No acute intracranial injury.  No calvarial fracture. No acute fracture or listhesis of the cervical spine. Spiculated density within the visualized right upper lobe, incompletely evaluated. This would be better assessed with dedicated imaging if indicated. Electronically Signed   By: Helyn Numbers M.D.   On: 11/26/2020 23:31   CT Cervical Spine Wo Contrast  Result Date: 11/26/2020 CLINICAL DATA:  Witnessed fall, head and neck trauma EXAM: CT HEAD WITHOUT CONTRAST CT CERVICAL SPINE WITHOUT CONTRAST TECHNIQUE: Multidetector CT imaging of the head and cervical spine was performed following the standard protocol without intravenous contrast. Multiplanar CT image reconstructions of the cervical spine were also generated. COMPARISON:  03/28/2019 FINDINGS: CT HEAD FINDINGS Brain: Normal anatomic configuration. Parenchymal volume loss is commensurate with the patient's age. Mild periventricular white matter changes are present likely reflecting the sequela of small vessel ischemia. No abnormal intra or extra-axial mass lesion or fluid collection. No abnormal mass effect or midline shift. No evidence of acute intracranial hemorrhage or infarct. Mild ventriculomegaly is stable. Cerebellum unremarkable. Vascular: No asymmetric hyperdense vasculature at the skull base. Skull: Intact Sinuses/Orbits: Paranasal sinuses are clear. Ocular lenses have been removed. Orbits are otherwise unremarkable.  Other: Mastoid air cells and middle ear cavities are clear. CT CERVICAL SPINE FINDINGS Alignment: Normal cervical lordosis. Skull base and vertebrae: Craniocervical alignment is normal. The atlantodental interval is not widened. Degenerative changes are noted at the atlantodental articulation. No acute fracture of the cervical spine. Vertebral body height is preserved. Soft tissues and spinal canal: No prevertebral fluid or swelling. No visible canal hematoma. Disc levels: There is diffuse intervertebral disc space narrowing and endplate remodeling throughout the cervical spine in keeping with severe degenerative disc disease. The prevertebral soft tissues are not thickened. Review of the axial images demonstrates no significant spinal canal stenosis. Multilevel facet arthrosis results in multilevel mild neuroforaminal narrowing, most severe on the right at C3-4. Upper chest: A sub solid spiculated density is seen within the visualized right apex, incompletely evaluated on this examination and indeterminate. Other: None IMPRESSION: No acute intracranial injury.  No calvarial fracture. No acute fracture or listhesis of the cervical spine. Spiculated density within the visualized right upper lobe, incompletely evaluated. This would be better assessed with dedicated imaging if indicated. Electronically Signed   By: Helyn Numbers M.D.   On: 11/26/2020 23:31   MR TOES LEFT WO CONTRAST  Result Date: 11/27/2020 CLINICAL DATA:  Osteomyelitis EXAM: MRI OF THE LEFT TOES WITHOUT CONTRAST TECHNIQUE: Multiplanar, multisequence MR imaging of the left toes was performed. No intravenous contrast was administered. COMPARISON:  X-ray 11/26/2020 FINDINGS: Nondiagnostic examination as patient was  unable to tolerate scanning. Multiple attempts were made to to assist patient with the exam and were unsuccessful. Two partial markedly motion degraded sequences were submitted which appears to demonstrate marrow edema in the region of  the great toe interphalangeal joint, although evaluation for osteomyelitis is nondiagnostic (series 4, image 11). IMPRESSION: 1. Nondiagnostic examination.  See above. 2. Apparent marrow edema in the region of the great toe interphalangeal joint, although evaluation for osteomyelitis is nondiagnostic. Electronically Signed   By: Duanne GuessNicholas  Plundo D.O.   On: 11/27/2020 12:09   DG CHEST PORT 1 VIEW  Result Date: 12/03/2020 CLINICAL DATA:  Follow-up pneumonia. EXAM: PORTABLE CHEST 1 VIEW COMPARISON:  12/02/2020 and older studies. FINDINGS: Patchy bilateral hazy airspace lung opacities and interstitial opacities are similar to the previous day's study. No new lung abnormalities. No convincing pleural effusion and no pneumothorax. Cardiac silhouette is normal in size. IMPRESSION: 1. No significant change from the previous day's exam. 2. Persistent interstitial and patchy bilateral hazy airspace lung opacities consistent with multifocal pneumonia Electronically Signed   By: Amie Portlandavid  Ormond M.D.   On: 12/03/2020 14:08   DG CHEST PORT 1 VIEW  Result Date: 12/02/2020 CLINICAL DATA:  Pneumonia EXAM: PORTABLE CHEST 1 VIEW COMPARISON:  Chest x-ray 11/28/2020 FINDINGS: Heart size is normal. Mediastinum appears grossly stable. Calcified plaques in the thoracic aorta. Hazy airspace opacities and prominent interstitial lung markings identified bilaterally, increased since previous study and right greater than left. No significant pleural effusion identified. No pneumothorax visualized. IMPRESSION: Increased patchy hazy airspace opacities and prominent interstitial opacities since previous study. Electronically Signed   By: Jannifer Hickelaney  Williams M.D.   On: 12/02/2020 13:41   DG CHEST PORT 1 VIEW  Result Date: 11/28/2020 CLINICAL DATA:  Cough.  Admitted 2 days ago after fall. EXAM: PORTABLE CHEST 1 VIEW COMPARISON:  February 13, 2020 FINDINGS: No pneumothorax. The cardiomediastinal silhouette is stable. New bibasilar  infiltrates. No other acute abnormalities. IMPRESSION: New bibasilar infiltrates consistent with pneumonia or aspiration. Recommend short-term follow-up imaging after treatment to ensure resolution. Electronically Signed   By: Gerome Samavid  Williams III M.D.   On: 11/28/2020 14:23   DG Foot Complete Left  Result Date: 11/26/2020 CLINICAL DATA:  Ingrown infected big toe.  Redness and swelling. EXAM: LEFT FOOT - COMPLETE 3+ VIEW COMPARISON:  None. FINDINGS: Vascular calcifications are identified. There is rare furcation of mild irregularity of the cortex at the base of the distal first phalanx. No other abnormalities. IMPRESSION: Osteomyelitis not excluded in the proximal aspect of the distal first phalanx. Recommend MRI for further assessment. Electronically Signed   By: Gerome Samavid  Williams III M.D.   On: 11/26/2020 22:45   VAS US ABI WITH/WO TBI  Result Date: 11/27/2020  LOWER EXTREMITY DOPPLER STUDY Patient Name:  Dayle PointsOrion J Pembleton  Date of Exam:   11/27/2020 Medical Rec #: [id number]      Accession #:    [id number] Date of Birth: 06-13-1924      Patient Gender: M Patient Age:   [age] years [facility] Procedure:      VAS US ABI WITH/WO TBI Referring Phys: Lyda PeroneJARED GARDNER --------------------------------------------------------------------------------  Indications: Ulceration. High Risk Factors: None.  Limitations: Today's exam was limited due to patient positioning, patient unable              to lie flat and constant patient movement, poor patient              cooperation, patient confusion. Comparison Study: No  prior studies. Performing Technologist: Carlos Levering RVT  Examination Guidelines: A complete evaluation includes at minimum, Doppler waveform signals and systolic blood pressure reading at the level of bilateral brachial, anterior tibial, and posterior tibial arteries, when vessel segments are accessible. Bilateral testing is considered an integral part of a complete examination.  Photoelectric Plethysmograph (PPG) waveforms and toe systolic pressure readings are included as required and additional duplex testing as needed. Limited examinations for reoccurring indications may be performed as noted.  ABI Findings: +---------+------------------+-----+---------+----------------+ Right    Rt Pressure (mmHg)IndexWaveform Comment          +---------+------------------+-----+---------+----------------+ Brachial 183                    triphasic                 +---------+------------------+-----+---------+----------------+ PTA                                      Unable to assess +---------+------------------+-----+---------+----------------+ DP       254               1.39 biphasic                  +---------+------------------+-----+---------+----------------+ Great Toe                                Unable to assess +---------+------------------+-----+---------+----------------+ +---------+------------------+-----+--------+----------------+ Left     Lt Pressure (mmHg)IndexWaveformComment          +---------+------------------+-----+--------+----------------+ PTA                                     Unable to assess +---------+------------------+-----+--------+----------------+ DP       150               0.82 biphasic                 +---------+------------------+-----+--------+----------------+ Great Toe                               Unable to assess +---------+------------------+-----+--------+----------------+ +-------+-----------+-----------+------------+------------+ ABI/TBIToday's ABIToday's TBIPrevious ABIPrevious TBI +-------+-----------+-----------+------------+------------+ Right  [place]                                             +-------+-----------+-----------+------------+------------+ Left   0.82                                           +-------+-----------+-----------+------------+------------+   Summary: Right:  Resting right ankle-brachial index indicates noncompressible right lower extremity arteries. Values are likely innacurate due to the above listed limitations. Unable to obtain posterior tibial artery values due to patient positioning and movement. Unable to obtain TBI due to constant patient movement. Left: Resting left ankle-brachial index indicates mild left lower extremity arterial disease. Values are likely innacurate due to the above listed limitations. Unable to obtain posterior tibial artery values due to patient positioning and movement. Unable to obtain TBI due to constant patient movement.  *See table(s) above for measurements  and observations.  Electronically signed by Jamelle Haring on 11/27/2020 at 11:07:00 AM.    Final     Microbiology: No results found for this or any previous visit (from the past 240 hour(s)).   Labs: Basic Metabolic Panel: Recent Labs  Lab 12/03/20 0528 12/05/20 0541 12/06/20 0524 12/07/20 0535 12/08/20 0535  NA 140 136 135 131* 132*  K 3.7 2.8* 3.0* 3.2* 3.9  CL 107 104 103 102 101  CO2 27 24 24 24 23   GLUCOSE 110* 121* 101* 114* 124*  BUN 21 20 17 16 17   CREATININE 0.97 0.95 1.04 1.02 1.07  CALCIUM 9.1 8.7* 8.6* 8.7* 8.6*  MG 1.9 1.9  --   --  1.8  PHOS 2.6 2.6  --   --  3.1   Liver Function Tests: No results for input(s): AST, ALT, ALKPHOS, BILITOT, PROT, ALBUMIN in the last 168 hours. No results for input(s): LIPASE, AMYLASE in the last 168 hours. No results for input(s): AMMONIA in the last 168 hours. CBC: Recent Labs  Lab 12/03/20 0528 12/05/20 0541 12/08/20 0535  WBC 9.3 9.2 7.3  HGB 11.2* 10.0* 10.4*  HCT 35.0* 30.8* 31.0*  MCV 92.3 91.9 89.1  PLT 289 337 431*   Cardiac Enzymes: No results for input(s): CKTOTAL, CKMB, CKMBINDEX, TROPONINI in the last 168 hours. BNP: BNP (last 3 results) No results for input(s): BNP in the last 8760 hours.  ProBNP (last 3 results) No results for input(s): PROBNP in the last 8760  hours.  CBG: No results for input(s): GLUCAP in the last 168 hours.     Signed:  Kayleen Memos, MD [facility] 12/09/2020, 4:09 PM

## 2020-12-09 NOTE — Progress Notes (Signed)
Civil engineer, contracting [facility]) Hospital Liaison note.    Registration paperwork has been completed. Please arrange transport. Care team made aware via secure chat.  RN please call report to [phone].  Please be sure the signed [facility] DNR transports with the patient.  Thank you for the opportunity to participate in this patient's care.  Gillian Scarce, BSN, RN [facility] Liaison (listed on [place] under [facility]ospice[facility])    [phone] (24h on call)

## 2020-12-09 NOTE — Progress Notes (Signed)
Civil engineer, contracting [facility]) Hospital Liaison note.    Referral received from [facility] for family interest in [facility]. Chart reviewed and eligibility confirmed for [facility]. Spoke with family to confirm interest and explain services. Family agreeable to transfer today. TOC aware.    [facility] will notify TOC when registration paperwork has been completed to arrange transport. At this time pt's wife is unable to either come to [facility] to do consents in person or to do consents via docu-sign.  [facility] social worker is working with family to explore options for completing paperwork TOC aware.  RN please call report to [phone].  Please be sure the signed [facility] DNR transports with the patient.  Thank you for the opportunity to participate in this patient's care.  Gillian Scarce, BSN, RN [facility] Liaison (listed on [place] under [facility])    [phone] (24h on call)

## 2020-12-09 NOTE — Progress Notes (Signed)
Nutrition Follow-up  DOCUMENTATION CODES:   Severe malnutrition in context of chronic illness  INTERVENTION:   -Comfort feeds as able  NUTRITION DIAGNOSIS:   Severe Malnutrition related to chronic illness (dementia) as evidenced by severe fat depletion, severe muscle depletion.  GOAL:   Patient will meet greater than or equal to 90% of their needs  Not meeting, on comfort feeds  MONITOR:   PO  ASSESSMENT:   Christopher Gross is a 87 y.o. male with medical history significant of BPH, dementia.  Patient continues to be disoriented x4. Visited pt along with nurse tech who was able to waken pt as much as possible. Pt unable to interact much, did not open eyes.  Pt refusing meals, supplements, etc. Tech felt pt was not alert enough to eat anything.   Noted that plan is now for pt to discharge to [facility].  Admission weight: 110 lbs.  Medications: Multivitamin with minerals daily  Labs reviewed:  Low Na  NUTRITION - FOCUSED PHYSICAL EXAM:  Flowsheet Row Most Recent Value  Orbital Region Moderate depletion  Upper Arm Region Severe depletion  Thoracic and Lumbar Region Severe depletion  Buccal Region Moderate depletion  Temple Region Severe depletion  Clavicle Bone Region Severe depletion  Clavicle and Acromion Bone Region Severe depletion  Scapular Bone Region Severe depletion  Dorsal Hand Severe depletion  Patellar Region Moderate depletion  Anterior Thigh Region Moderate depletion  Posterior Calf Region Severe depletion  Edema (RD Assessment) None  Hair Reviewed  Eyes Unable to assess  [wouldn't keep eyes open]  Mouth Unable to assess  [wouldn't follow commands]  Skin Reviewed  [bruising]  Nails Reviewed  [dirty]       Diet Order:   Diet Order             DIET - DYS 1 Room service appropriate? No; Fluid consistency: Thin  Diet effective now                   EDUCATION NEEDS:   No education needs have been identified at this time  Skin:   Skin Assessment: Skin Integrity Issues: Skin Integrity Issues:: Other (Comment) Other: MASD mid perineum  Last BM:  12/1 -type 6  Height:   Ht Readings from Last 1 Encounters:  11/26/20 5\' 4"  (1.626 m)    Weight:   Wt Readings from Last 1 Encounters:  11/26/20 49.9 kg    Ideal Body Weight:  59.1 kg  BMI:  Body mass index is 18.88 kg/m.  Estimated Nutritional Needs:   Kcal:  1550-1750  Protein:  75-90 grams  Fluid:  > 1.5 L  11/28/20, MS, RD, LDN Inpatient Clinical Dietitian Contact information available via [employer]

## 2020-12-09 NOTE — Progress Notes (Signed)
Called [employer] and gave report to [employer]. No questions at this time.

## 2020-12-09 NOTE — Progress Notes (Addendum)
Daily Progress Note   Patient Name: Christopher Gross       Date: 12/09/2020 DOB: 1924/05/12  Age: 87 y.o. MRN#: [id number] Attending Physician: Darlin Drop, DO Primary Care Physician: Etta Grandchild, MD Admit Date: 11/26/2020  Reason for Consultation/Follow-up: Establishing goals of care  Subjective: I saw and examined Mr. Christopher Gross.  He was sitting in bed in no distress.  Did not open his eyes but did respond "No" when asked if he was hurting.  Discussed with bedside care team.  Intake remains minimal.  He does not ask for intake and often refuses.  Had small amount of pudding this morning with great deal of encouragement.   Length of Stay: 10  Current Medications: Scheduled Meds:   enoxaparin (LOVENOX) injection  30 mg Subcutaneous Q24H   feeding supplement  237 mL Oral TID BM   multivitamin with minerals  1 tablet Oral Daily   mupirocin ointment   Topical BID    Continuous Infusions:    PRN Meds: acetaminophen **OR** acetaminophen, ondansetron **OR** ondansetron (ZOFRAN) IV  Physical Exam          Sleepy No distress Regular work of breathing S 1 S 2  Abdomen is not distended  Vital Signs: BP (!) 148/74 (BP Location: Right Leg)   Pulse 90   Temp 98.2 F (36.8 C)   Resp 19   Ht 5\' 4"  (1.626 m)   Wt 49.9 kg   SpO2 94%   BMI 18.88 kg/m  SpO2: SpO2: 94 % O2 Device: O2 Device: Room Air O2 Flow Rate:    Intake/output summary:  Intake/Output Summary (Last 24 hours) at 12/09/2020 1032 Last data filed at 12/09/2020 0900 Gross per 24 hour  Intake 118 ml  Output 900 ml  Net -782 ml    LBM: Last BM Date: 12/07/20 Baseline Weight: Weight: 49.9 kg Most recent weight: Weight: 49.9 kg       Palliative Assessment/Data:      Patient Active Problem List   Diagnosis  Date Noted   Palliative care by specialist    General weakness    Hypernatremia 11/30/2020   Fever 11/29/2020   Fall 11/29/2020   Pneumonia 11/28/2020   Dysphagia 11/28/2020   Fall at home, initial encounter 11/27/2020   Abnormal CT scan 11/27/2020   Infection of  great toe 11/26/2020   Acute encephalopathy 11/26/2020   Dementia (HCC) 11/26/2020   GAD (generalized anxiety disorder) 04/21/2020   Goals of care, counseling/discussion 02/16/2020   Deficiency anemia 02/13/2020   Cough with sputum 02/13/2020   Inguinal hernia of left side without obstruction or gangrene 02/13/2020   BPH (benign prostatic hypertrophy) 03/19/2014   Routine general medical examination at a health care facility 03/19/2014    Palliative Care Assessment & Plan   Patient Profile:    Assessment:  87 yrs old male with PMH significant of BPH, dementia who presented with s/p mechanical fall.  He was noted to have ingrown toenail on L first toe with erythema of first toe and was started on abx with podiatry consult.  Podiatry recommending abx only and with low suspicion for osteomyelitis. Hospitalization complicated by delirium and aspiration pneumonia.  Palliative care following for GOC.   Recommendations/Plan: -Referral placed to [facility] per family preference.  He seems up and down with his mental status based on chart, but has been very sleepy and minimally interactive during my encounters.  He did have supplemental fluids running until yesterday and I am concerned he will continue to decline as these have been stopped and he has very little intake.    Code Status:    Code Status Orders  (From admission, onward)           Start     Ordered   11/28/20 1133  Do not attempt resuscitation (DNR)  Continuous       Question Answer Comment  In the event of cardiac or respiratory ARREST Do not call a "code blue"   In the event of cardiac or respiratory ARREST Do not perform Intubation, CPR, defibrillation  or ACLS   In the event of cardiac or respiratory ARREST Use medication by any route, position, wound care, and other measures to relive pain and suffering. May use oxygen, suction and manual treatment of airway obstruction as needed for comfort.      11/28/20 1132           Code Status History     Date Active Date Inactive Code Status Order ID Comments User Context   11/26/2020 2353 11/28/2020 1132 Full Code [id number]  Hillary Bow, DO ED       Prognosis:  ? weeks  Discharge Planning: - Residential hospice vs LTC with hospice support  Care plan was discussed with IDT, bedside RN.    Thank you for allowing the Palliative Medicine Team to assist in the care of this patient.   Total Time 25 Prolonged Time Billed  no     Greater than 50%  of this time was spent counseling and coordinating care related to the above assessment and plan.   Romie Minus, MD  Please contact Palliative Medicine Team phone at [phone] for questions and concerns.

## 2020-12-09 NOTE — TOC Transition Note (Signed)
Transition of Care [facility]) - CM/SW Discharge Note   Patient Details  Name: Christopher Gross MRN: [id number] Date of Birth: 04-01-1924  Transition of Care [facility]) CM/SW Contact:  Ida Rogue, LCSW Phone Number: 12/09/2020, 4:08 PM   Clinical Narrative:   Patient who is stable for d/c will transfer to [facility] today.  Family on board.  PTAR arranged.  Nursing, please call report to [phone]. TOC sign off.    Final next level of care: Hospice Medical Facility Barriers to Discharge: Barriers Resolved   Patient Goals and CMS Choice        Discharge Placement                       Discharge Plan and Services                                     Social Determinants of Health (SDOH) Interventions     Readmission Risk Interventions No flowsheet data found.

## 2020-12-09 NOTE — TOC Progression Note (Signed)
Transition of Care [facility]) - Progression Note    Patient Details  Name: TREVION HOBEN MRN: [id number] Date of Birth: October 09, 1924  Transition [facility] Care [facility]) CM/SW Contact  Ida Rogue, [place] Phone Number: 12/09/2020, 9:09 AM  Clinical Narrative:   Referral made to [facility] for hospice services after confirming with Dr Nira Retort that wife is wanting services from same.   Asked them to assess for [facility] appropriateness. TOC will continue to follow during the course of hospitalization.     Expected Discharge Plan: Rest Home Barriers to Discharge: SNF Pending bed offer  Expected Discharge Plan and Services Expected Discharge Plan: Rest Home                                               Social Determinants of Health (SDOH) Interventions    Readmission Risk Interventions No flowsheet data found.

## 2020-12-09 NOTE — Progress Notes (Signed)
PROGRESS NOTE    NAYDEN CZAJKA  TZG:[id number] DOB: 01-29-1924 DOA: 11/26/2020 PCP: Etta Grandchild, MD   Brief Narrative:  This 87 yrs old male with PMH significant of BPH, dementia who presented from home after a mechanical fall.  He had a left foot x-ray which showed suspicion for osteomyelitis in the proximal aspect of the distal first phalanx.  He was noted to have ingrown toenail on L first toe with erythema of first toe and was started on abx with podiatry consult.  Podiatry recommended abx only and with low suspicion for osteomyelitis.   Hospitalization complicated by delirium and aspiration pneumonia.  He completed 7 days of antibiotics, last 12/05/2020.  Was seen by speech therapist with recommendation for comfort feeds.  Seen by PT OT with recommendation for long-term institutional care without follow-up therapy.  Palliative care team consulted, following, and assisting with goals of care discussions.  12/09/2020: Patient seen and examined at bedside.  Minimally interactive.  He has no complaints.  Seen by palliative, referral placed to [facility] per family preference.  Plan for residential hospice versus long-term care with hospice support.  Assessment & Plan:   Principal Problem:   Fall at home, initial encounter Active Problems:   Goals of care, counseling/discussion   Infection of great toe   Acute encephalopathy   Dementia (HCC)   Abnormal CT scan   Pneumonia   Dysphagia   Fever   Fall   Hypernatremia   Palliative care by specialist   General weakness  Fall at home, Initial encounter: CT head/neck without any acute findings. PT and OT with recommendation for long-term institutional care without follow-up therapy. Continue fall precautions  Goals of care discussion:  Seen by palliative, referral placed to [facility] per family preference.  Plan for residential hospice versus long-term care with hospice support.  Pneumonia: CXR concerning for pneumonia with  bibasilar infiltrates with pneumonia or aspiration. Completed course of IV antibiotics Rocephin and IV Flagyl.  Urine culture : No growth and Blood culture NGTD Speech eval recommended comfort feeds. Completed antibiotic course for 7 days( last date 12/05/20)  Dementia:  Stable.  Continue delirium precautions Fall precautions, aspiration precautions   Acute encephalopathy : Likely secondary to acute hospitalization, dementia, infection. Continue delirium precautions. Continue soft mittens for safety.   Dysphagia: Speech eval. Recommending comfort feeds.  Ingrown toenail left foot: MRI not diagnostic.  CRP 5.6, Sed rate 57. Discussed with podiatry, recommended Keflex, mupirocin ointment and dressing changes around the nail. Completed ceftriaxone and Flagyl for pneumonia.   Resolved posttreatment hypernatremia secondary to poor oral intake: D5W DC'd on 12/08/2020. Oral intake has not improved Restarted IV fluid hydration on 12/09/2020, normal saline D5W at 50 cc/h x 2 days.  Resolved post repletion: Hypokalemia:  Abnormal CT scan: Spiculated density of visualized right upper lobe, incidentally found on CT cervical spine.      DVT prophylaxis: Lovenox subcu daily Code Status:DNR Family Communication: No family at bedside. Disposition Plan:   Status is: Inpatient  Medically cleared for discharge.   Consultants:  Podiatry Palliative care  Procedures: None.  Antimicrobials:  Anti-infectives (From admission, onward)    Start     Dose/Rate Route Frequency Ordered Stop   11/29/20 1315  metroNIDAZOLE (FLAGYL) IVPB 500 mg  Status:  Discontinued        500 mg 100 mL/hr over 60 Minutes Intravenous Every 12 hours 11/29/20 1222 12/06/20 0859   11/28/20 1800  cefTRIAXone (ROCEPHIN) 1 g in sodium  chloride 0.9 % 100 mL IVPB  Status:  Discontinued        1 g 200 mL/hr over 30 Minutes Intravenous Every 24 hours 11/28/20 1649 12/06/20 0859   11/28/20 1215  ceFAZolin (ANCEF)  IVPB 1 g/50 mL premix  Status:  Discontinued        1 g 100 mL/hr over 30 Minutes Intravenous Every 12 hours 11/28/20 1125 11/28/20 1649   11/27/20 1000  cefTRIAXone (ROCEPHIN) 2 g in sodium chloride 0.9 % 100 mL IVPB  Status:  Discontinued       See Hyperspace for full Linked Orders Report.   2 g 200 mL/hr over 30 Minutes Intravenous Every 24 hours 11/26/20 2349 11/28/20 1122   11/27/20 1000  metroNIDAZOLE (FLAGYL) IVPB 500 mg  Status:  Discontinued        500 mg 100 mL/hr over 60 Minutes Intravenous Every 12 hours 11/27/20 0048 11/28/20 1122   11/27/20 0000  metroNIDAZOLE (FLAGYL) tablet 500 mg  Status:  Discontinued       See Hyperspace for full Linked Orders Report.   500 mg Oral Every 12 hours 11/26/20 2349 11/27/20 0048   11/26/20 2345  piperacillin-tazobactam (ZOSYN) IVPB 3.375 g        3.375 g 100 mL/hr over 30 Minutes Intravenous  Once 11/26/20 2335 11/27/20 0013         Objective: Vitals:   12/07/20 2155 12/08/20 0511 12/08/20 1417 12/08/20 1603  BP: 138/61 (!) 115/46 (!) 141/78 (!) 148/74  Pulse: 88 85 91 90  Resp: 16 20 20 19   Temp: 97.8 F (36.6 C) 98.2 F (36.8 C) 98 F (36.7 C) 98.2 F (36.8 C)  TempSrc: Oral Oral Oral   SpO2: 94% 96% 96% 94%  Weight:      Height:        Intake/Output Summary (Last 24 hours) at 12/09/2020 1540 Last data filed at 12/09/2020 0900 Gross per 24 hour  Intake 118 ml  Output 900 ml  Net -782 ml   Filed Weights   11/26/20 2153  Weight: 49.9 kg    Examination:  General exam: Frail-appearing in no acute distress.  He is minimally interactive.   Respiratory system: Clear to auscultation with no wheezes or rales. Cardiovascular system: Regular rate and rhythm no rubs or gallops.   Gastrointestinal system: Soft nontender normal bowel sounds present.   Central nervous system: Alert, minimally interactive.   Extremities: Trace lower extremity edema. Skin: Pressure wound, POA.   Psychiatry: Mood is appropriate for condition and  setting.    Data Reviewed: I have personally reviewed following labs and imaging studies  CBC: Recent Labs  Lab 12/03/20 0528 12/05/20 0541 12/08/20 0535  WBC 9.3 9.2 7.3  HGB 11.2* 10.0* 10.4*  HCT 35.0* 30.8* 31.0*  MCV 92.3 91.9 89.1  PLT 289 337 [id number]*   Basic Metabolic Panel: Recent Labs  Lab 12/03/20 0528 12/05/20 0541 12/06/20 0524 12/07/20 0535 12/08/20 0535  NA 140 136 135 131* 132*  K 3.7 2.8* 3.0* 3.2* 3.9  CL 107 104 103 102 101  CO2 27 24 24 24 23   GLUCOSE 110* 121* 101* 114* 124*  BUN 21 20 17 16 17   CREATININE 0.97 0.95 1.04 1.02 1.07  CALCIUM 9.1 8.7* 8.6* 8.7* 8.6*  MG 1.9 1.9  --   --  1.8  PHOS 2.6 2.6  --   --  3.1   GFR: Estimated Creatinine Clearance: 29.8 mL/min (by C-G formula based on SCr of 1.07 mg/dL).  Liver Function Tests: No results for input(s): AST, ALT, ALKPHOS, BILITOT, PROT, ALBUMIN in the last 168 hours.  No results for input(s): LIPASE, AMYLASE in the last 168 hours. No results for input(s): AMMONIA in the last 168 hours. Coagulation Profile: No results for input(s): INR, PROTIME in the last 168 hours. Cardiac Enzymes: No results for input(s): CKTOTAL, CKMB, CKMBINDEX, TROPONINI in the last 168 hours. BNP (last 3 results) No results for input(s): PROBNP in the last 8760 hours. HbA1C: No results for input(s): HGBA1C in the last 72 hours. CBG: No results for input(s): GLUCAP in the last 168 hours. Lipid Profile: No results for input(s): CHOL, HDL, LDLCALC, TRIG, CHOLHDL, LDLDIRECT in the last 72 hours. Thyroid Function Tests: No results for input(s): TSH, T4TOTAL, FREET4, T3FREE, THYROIDAB in the last 72 hours. Anemia Panel: No results for input(s): VITAMINB12, FOLATE, FERRITIN, TIBC, IRON, RETICCTPCT in the last 72 hours. Sepsis Labs: Recent Labs  Lab 12/03/20 1256  PROCALCITON 0.23    No results found for this or any previous visit (from the past 240 hour(s)).   Radiology Studies: No results found.  Scheduled  Meds:  enoxaparin (LOVENOX) injection  30 mg Subcutaneous Q24H   feeding supplement  237 mL Oral TID BM   multivitamin with minerals  1 tablet Oral Daily   mupirocin ointment   Topical BID   Continuous Infusions:  dextrose 5 % and 0.9% NaCl        LOS: 10 days    Time spent: 25 mins    Kayleen Memos, MD [facility]   If 7PM-7AM, please contact night-coverage

## 2022-03-06 IMAGING — DX DG CHEST 1V PORT
1 series · 1 of 1 positions shown · non-contrast
Comparison: February 13, 2020

CLINICAL DATA: Cough.  Admitted 2 days ago after fall.

EXAM:
PORTABLE CHEST 1 VIEW

[chest ap]
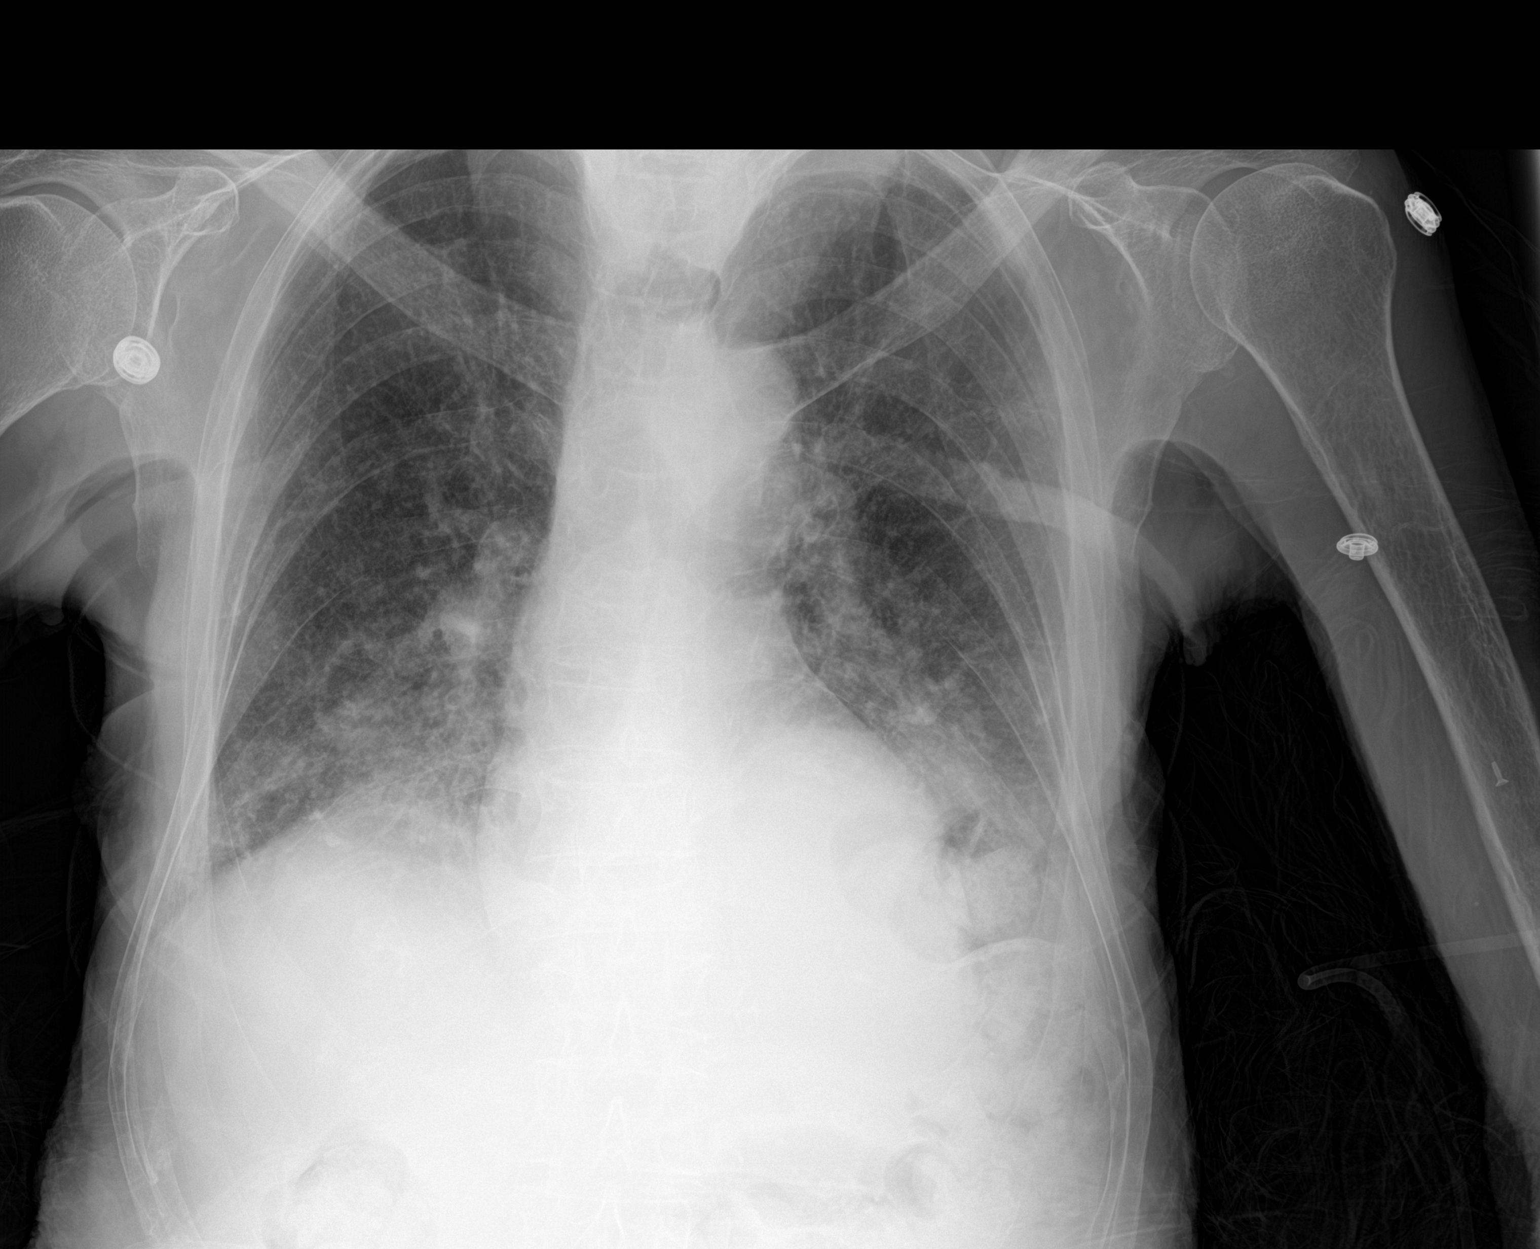

[1 of 1 positions shown; findings below may reference images not displayed]

FINDINGS: No pneumothorax. The cardiomediastinal silhouette is stable. New
bibasilar infiltrates. No other acute abnormalities.
IMPRESSION: New bibasilar infiltrates consistent with pneumonia or aspiration.
Recommend short-term follow-up imaging after treatment to ensure
resolution.

## 2022-03-10 IMAGING — DX DG CHEST 1V PORT
1 series · 1 of 1 positions shown · non-contrast
Comparison: Chest x-ray 11/28/2020

CLINICAL DATA: Pneumonia

EXAM:
PORTABLE CHEST 1 VIEW

[chest ap]
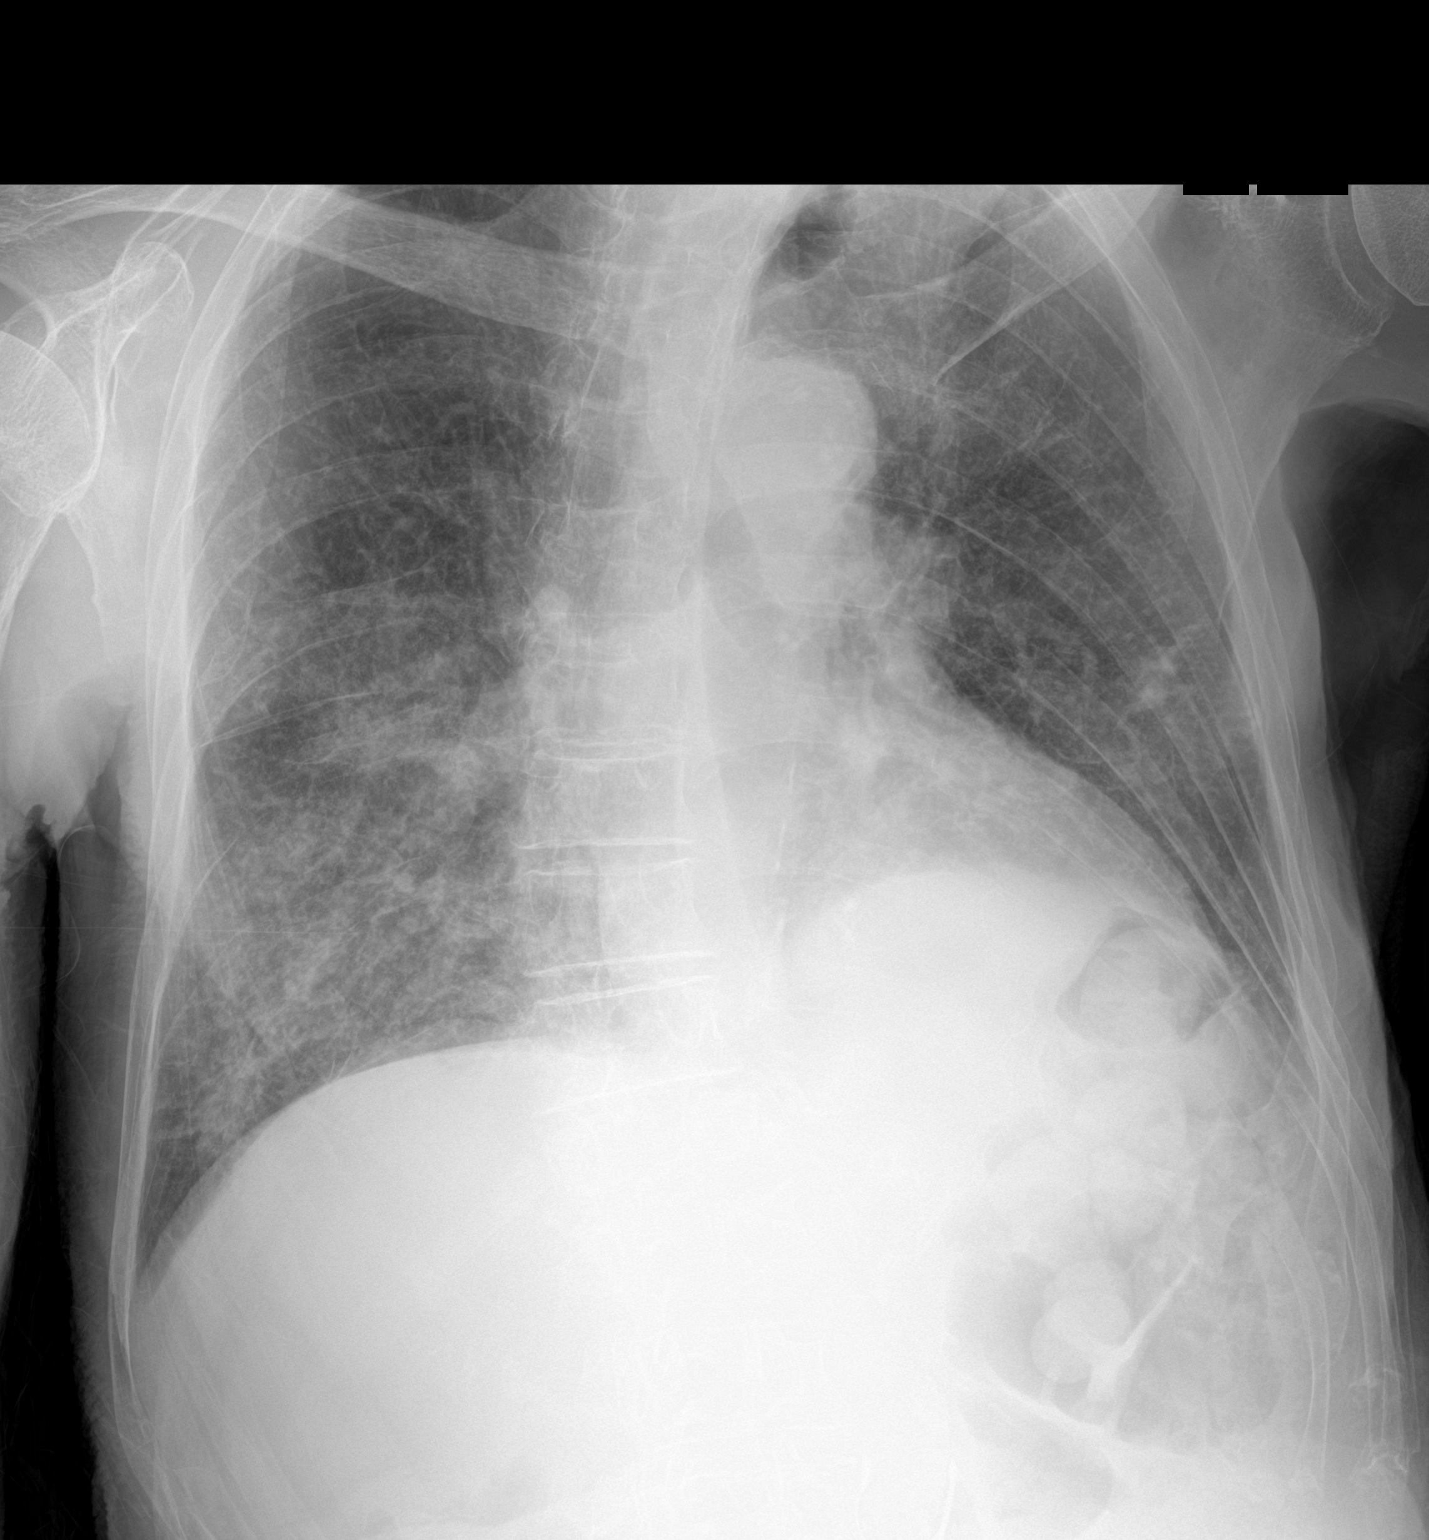

[1 of 1 positions shown; findings below may reference images not displayed]

FINDINGS: Heart size is normal. Mediastinum appears grossly stable. Calcified
plaques in the thoracic aorta. Hazy airspace opacities and prominent
interstitial lung markings identified bilaterally, increased since
previous study and right greater than left. No significant pleural
effusion identified. No pneumothorax visualized.
IMPRESSION: Increased patchy hazy airspace opacities and prominent interstitial
opacities since previous study.

## 2022-03-11 IMAGING — DX DG CHEST 1V PORT
1 series · 1 of 1 positions shown · non-contrast
Comparison: 12/02/2020 and older studies.

CLINICAL DATA: Follow-up pneumonia.

EXAM:
PORTABLE CHEST 1 VIEW

[chest ap]
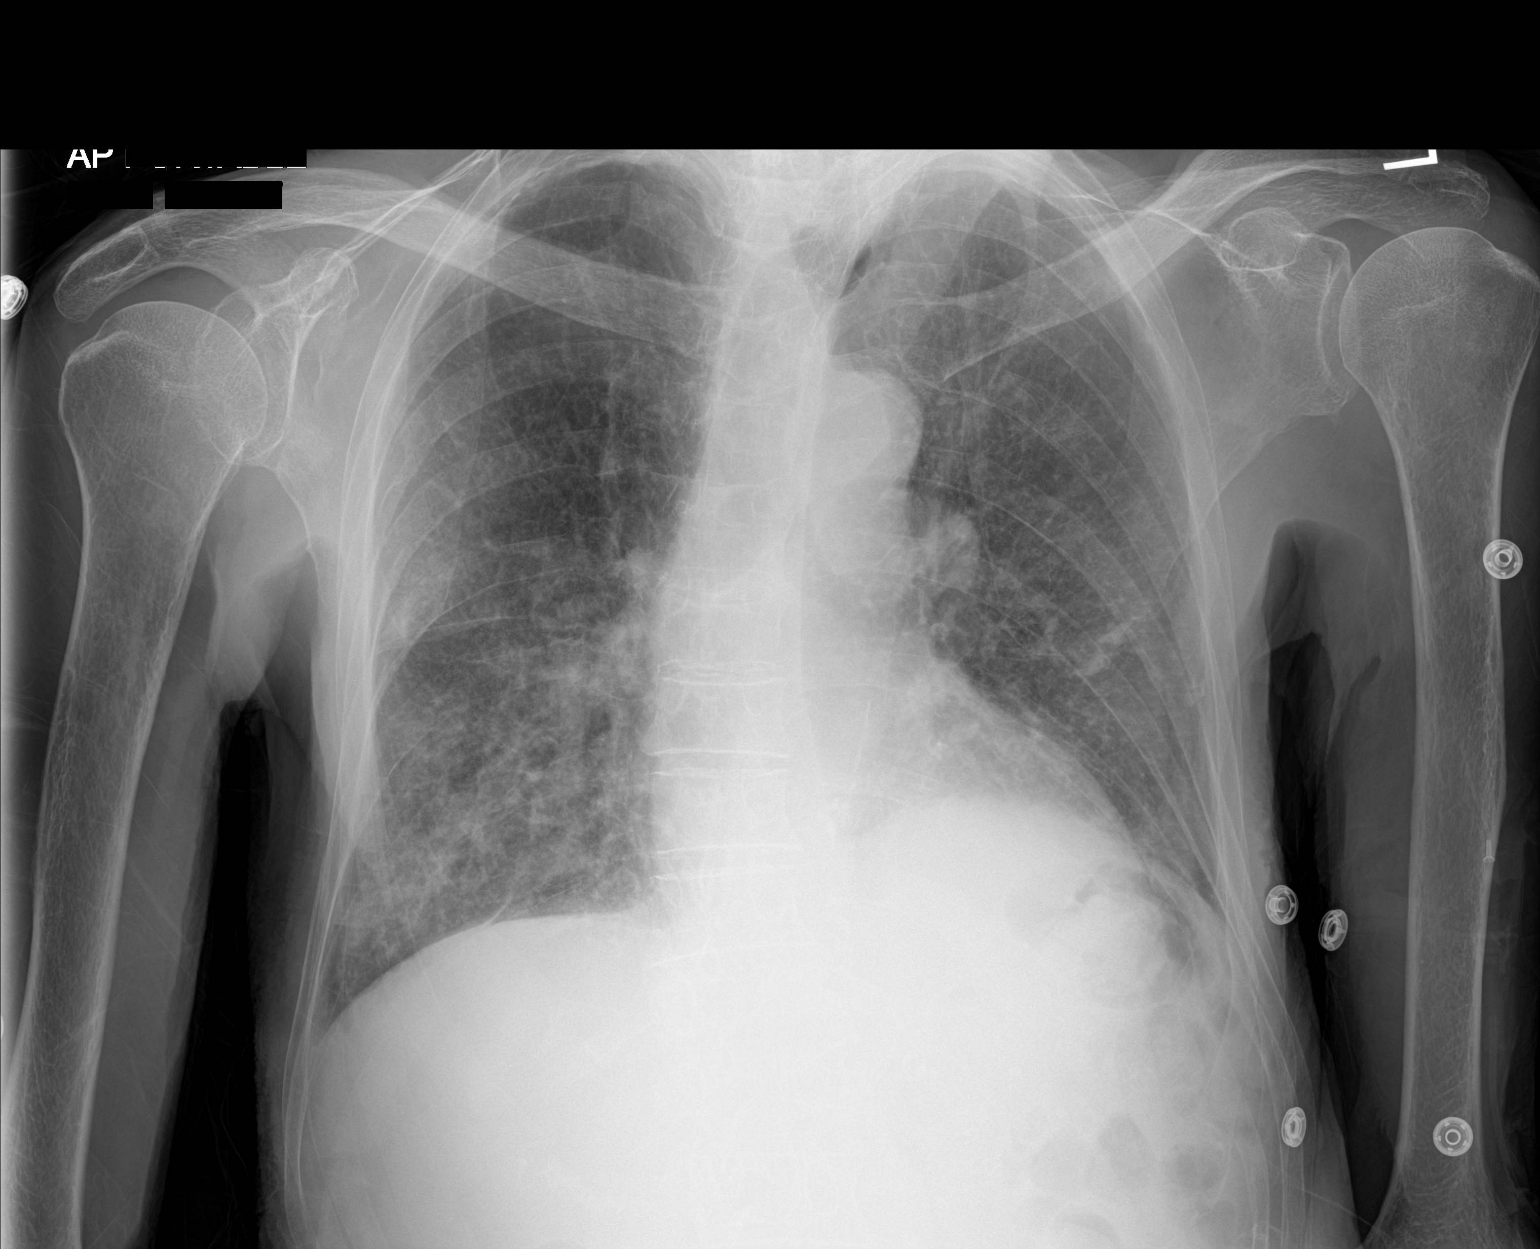

[1 of 1 positions shown; findings below may reference images not displayed]

FINDINGS: Patchy bilateral hazy airspace lung opacities and interstitial
opacities are similar to the previous day's study. No new lung
abnormalities.

No convincing pleural effusion and no pneumothorax.

Cardiac silhouette is normal in size.
IMPRESSION: 1. No significant change from the previous day's exam.
2. Persistent interstitial and patchy bilateral hazy airspace lung
opacities consistent with multifocal pneumonia
